# Patient Record
Sex: Male | Born: 1935 | Race: White | Hispanic: No | Smoking: Former smoker
Health system: Southern US, Community
[De-identification: ages and names within clinical notes are randomized; demographics above are authoritative.]

## PROBLEM LIST (undated history)

## (undated) DIAGNOSIS — I10 Essential (primary) hypertension: Secondary | ICD-10-CM

## (undated) DIAGNOSIS — I739 Peripheral vascular disease, unspecified: Secondary | ICD-10-CM

## (undated) DIAGNOSIS — I2510 Atherosclerotic heart disease of native coronary artery without angina pectoris: Secondary | ICD-10-CM

## (undated) DIAGNOSIS — E119 Type 2 diabetes mellitus without complications: Secondary | ICD-10-CM

## (undated) DIAGNOSIS — K219 Gastro-esophageal reflux disease without esophagitis: Secondary | ICD-10-CM

## (undated) DIAGNOSIS — M1990 Unspecified osteoarthritis, unspecified site: Secondary | ICD-10-CM

## (undated) DIAGNOSIS — I219 Acute myocardial infarction, unspecified: Secondary | ICD-10-CM

## (undated) DIAGNOSIS — I5022 Chronic systolic (congestive) heart failure: Secondary | ICD-10-CM

## (undated) DIAGNOSIS — C44321 Squamous cell carcinoma of skin of nose: Secondary | ICD-10-CM

## (undated) DIAGNOSIS — N200 Calculus of kidney: Secondary | ICD-10-CM

## (undated) DIAGNOSIS — E785 Hyperlipidemia, unspecified: Secondary | ICD-10-CM

## (undated) DIAGNOSIS — Z9289 Personal history of other medical treatment: Secondary | ICD-10-CM

## (undated) HISTORY — PX: SKIN CANCER EXCISION: SHX779

## (undated) HISTORY — PX: CATARACT EXTRACTION W/ INTRAOCULAR LENS  IMPLANT, BILATERAL: SHX1307

## (undated) HISTORY — PX: LEG AMPUTATION ABOVE KNEE: SHX117

## (undated) HISTORY — PX: ABDOMINAL HERNIA REPAIR: SHX539

## (undated) HISTORY — PX: CHOLECYSTECTOMY: SHX55

## (undated) HISTORY — PX: INGUINAL HERNIA REPAIR: SUR1180

## (undated) HISTORY — PX: CORONARY ANGIOPLASTY WITH STENT PLACEMENT: SHX49

## (undated) HISTORY — PX: CAROTID STENT: SHX1301

## (undated) HISTORY — PX: HERNIA REPAIR: SHX51

## (undated) HISTORY — PX: MELANOMA EXCISION: SHX5266

## (undated) HISTORY — PX: CORONARY ANGIOPLASTY: SHX604

## (undated) HISTORY — PX: TRANSURETHRAL RESECTION OF PROSTATE: SHX73

## (undated) HISTORY — DX: Hyperlipidemia, unspecified: E78.5

---

## 1983-11-27 HISTORY — PX: ANGIOPLASTY: SHX39

## 1994-11-26 HISTORY — PX: CORONARY ARTERY BYPASS GRAFT: SHX141

## 2013-12-09 ENCOUNTER — Inpatient Hospital Stay (HOSPITAL_COMMUNITY)
Admission: EM | Admit: 2013-12-09 | Discharge: 2013-12-16 | DRG: 247 | Disposition: A | Discharge status: Home / Self Care | Payer: Medicare Other | Attending: Cardiovascular Disease | Admitting: Cardiovascular Disease

## 2013-12-09 ENCOUNTER — Encounter (HOSPITAL_COMMUNITY): Payer: Self-pay | Admitting: Emergency Medicine

## 2013-12-09 ENCOUNTER — Encounter (HOSPITAL_COMMUNITY)
Admission: EM | Disposition: A | Discharge status: Home / Self Care | Payer: Medicare Other | Source: Home / Self Care | Attending: Cardiovascular Disease

## 2013-12-09 ENCOUNTER — Emergency Department (HOSPITAL_COMMUNITY): Payer: Medicare Other

## 2013-12-09 DIAGNOSIS — I214 Non-ST elevation (NSTEMI) myocardial infarction: Principal | ICD-10-CM | POA: Diagnosis present

## 2013-12-09 DIAGNOSIS — Z7902 Long term (current) use of antithrombotics/antiplatelets: Secondary | ICD-10-CM

## 2013-12-09 DIAGNOSIS — I252 Old myocardial infarction: Secondary | ICD-10-CM

## 2013-12-09 DIAGNOSIS — Z79899 Other long term (current) drug therapy: Secondary | ICD-10-CM

## 2013-12-09 DIAGNOSIS — I2510 Atherosclerotic heart disease of native coronary artery without angina pectoris: Secondary | ICD-10-CM | POA: Diagnosis present

## 2013-12-09 DIAGNOSIS — A310 Pulmonary mycobacterial infection: Secondary | ICD-10-CM | POA: Diagnosis present

## 2013-12-09 DIAGNOSIS — I200 Unstable angina: Secondary | ICD-10-CM | POA: Insufficient documentation

## 2013-12-09 DIAGNOSIS — I509 Heart failure, unspecified: Secondary | ICD-10-CM

## 2013-12-09 DIAGNOSIS — I25810 Atherosclerosis of coronary artery bypass graft(s) without angina pectoris: Secondary | ICD-10-CM

## 2013-12-09 DIAGNOSIS — Z7982 Long term (current) use of aspirin: Secondary | ICD-10-CM

## 2013-12-09 DIAGNOSIS — G547 Phantom limb syndrome without pain: Secondary | ICD-10-CM | POA: Diagnosis present

## 2013-12-09 DIAGNOSIS — I059 Rheumatic mitral valve disease, unspecified: Secondary | ICD-10-CM | POA: Diagnosis present

## 2013-12-09 DIAGNOSIS — L03119 Cellulitis of unspecified part of limb: Secondary | ICD-10-CM

## 2013-12-09 DIAGNOSIS — L02519 Cutaneous abscess of unspecified hand: Secondary | ICD-10-CM | POA: Diagnosis present

## 2013-12-09 DIAGNOSIS — I739 Peripheral vascular disease, unspecified: Secondary | ICD-10-CM | POA: Diagnosis present

## 2013-12-09 DIAGNOSIS — S78119A Complete traumatic amputation at level between unspecified hip and knee, initial encounter: Secondary | ICD-10-CM

## 2013-12-09 DIAGNOSIS — Z9089 Acquired absence of other organs: Secondary | ICD-10-CM

## 2013-12-09 DIAGNOSIS — R230 Cyanosis: Secondary | ICD-10-CM | POA: Diagnosis present

## 2013-12-09 DIAGNOSIS — Z888 Allergy status to other drugs, medicaments and biological substances status: Secondary | ICD-10-CM

## 2013-12-09 DIAGNOSIS — Z9861 Coronary angioplasty status: Secondary | ICD-10-CM

## 2013-12-09 DIAGNOSIS — I10 Essential (primary) hypertension: Secondary | ICD-10-CM

## 2013-12-09 DIAGNOSIS — I2582 Chronic total occlusion of coronary artery: Secondary | ICD-10-CM | POA: Diagnosis present

## 2013-12-09 DIAGNOSIS — I70229 Atherosclerosis of native arteries of extremities with rest pain, unspecified extremity: Secondary | ICD-10-CM | POA: Diagnosis present

## 2013-12-09 DIAGNOSIS — Z87891 Personal history of nicotine dependence: Secondary | ICD-10-CM

## 2013-12-09 HISTORY — DX: Peripheral vascular disease, unspecified: I73.9

## 2013-12-09 HISTORY — DX: Atherosclerotic heart disease of native coronary artery without angina pectoris: I25.10

## 2013-12-09 HISTORY — DX: Chronic systolic (congestive) heart failure: I50.22

## 2013-12-09 HISTORY — DX: Personal history of other medical treatment: Z92.89

## 2013-12-09 HISTORY — DX: Unspecified osteoarthritis, unspecified site: M19.90

## 2013-12-09 HISTORY — PX: LEFT HEART CATHETERIZATION WITH CORONARY/GRAFT ANGIOGRAM: SHX5450

## 2013-12-09 HISTORY — PX: PERCUTANEOUS CORONARY STENT INTERVENTION (PCI-S): SHX5485

## 2013-12-09 HISTORY — DX: Essential (primary) hypertension: I10

## 2013-12-09 LAB — URINALYSIS, ROUTINE W REFLEX MICROSCOPIC
Bilirubin Urine: NEGATIVE
GLUCOSE, UA: NEGATIVE mg/dL
HGB URINE DIPSTICK: NEGATIVE
Ketones, ur: NEGATIVE mg/dL
Leukocytes, UA: NEGATIVE
Nitrite: NEGATIVE
PH: 5 (ref 5.0–8.0)
Protein, ur: NEGATIVE mg/dL
SPECIFIC GRAVITY, URINE: 1.02 (ref 1.005–1.030)
Urobilinogen, UA: 0.2 mg/dL (ref 0.0–1.0)

## 2013-12-09 LAB — POCT ACTIVATED CLOTTING TIME
Activated Clotting Time: 105 seconds
Activated Clotting Time: 382 seconds

## 2013-12-09 LAB — COMPREHENSIVE METABOLIC PANEL
ALBUMIN: 3.6 g/dL (ref 3.5–5.2)
ALT: 16 U/L (ref 0–53)
AST: 13 U/L (ref 0–37)
Alkaline Phosphatase: 71 U/L (ref 39–117)
BUN: 26 mg/dL — ABNORMAL HIGH (ref 6–23)
CALCIUM: 9.1 mg/dL (ref 8.4–10.5)
CO2: 21 meq/L (ref 19–32)
Chloride: 103 mEq/L (ref 96–112)
Creatinine, Ser: 1.03 mg/dL (ref 0.50–1.35)
GFR calc Af Amer: 74 mL/min — ABNORMAL LOW (ref >90)
GFR calc non Af Amer: 64 mL/min — ABNORMAL LOW (ref >90)
Glucose, Bld: 123 mg/dL — ABNORMAL HIGH (ref 70–99)
Potassium: 4.8 mEq/L (ref 3.7–5.3)
SODIUM: 138 meq/L (ref 137–147)
Total Bilirubin: 0.3 mg/dL (ref 0.3–1.2)
Total Protein: 7 g/dL (ref 6.0–8.3)

## 2013-12-09 LAB — CBC WITH DIFFERENTIAL/PLATELET
BASOS ABS: 0 10*3/uL (ref 0.0–0.1)
Basophils Relative: 0 % (ref 0–1)
EOS PCT: 1 % (ref 0–5)
Eosinophils Absolute: 0.1 10*3/uL (ref 0.0–0.7)
HCT: 33.5 % — ABNORMAL LOW (ref 39.0–52.0)
Hemoglobin: 11.3 g/dL — ABNORMAL LOW (ref 13.0–17.0)
Lymphocytes Relative: 15 % (ref 12–46)
Lymphs Abs: 1 10*3/uL (ref 0.7–4.0)
MCH: 29 pg (ref 26.0–34.0)
MCHC: 33.7 g/dL (ref 30.0–36.0)
MCV: 85.9 fL (ref 78.0–100.0)
Monocytes Absolute: 0.8 10*3/uL (ref 0.1–1.0)
Monocytes Relative: 12 % (ref 3–12)
NEUTROS ABS: 4.9 10*3/uL (ref 1.7–7.7)
Neutrophils Relative %: 72 % (ref 43–77)
PLATELETS: 93 10*3/uL — AB (ref 150–400)
RBC: 3.9 MIL/uL — ABNORMAL LOW (ref 4.22–5.81)
RDW: 16 % — ABNORMAL HIGH (ref 11.5–15.5)
WBC: 6.8 10*3/uL (ref 4.0–10.5)

## 2013-12-09 LAB — TROPONIN I
TROPONIN I: 0.39 ng/mL — AB (ref <0.30)
Troponin I: 0.35 ng/mL (ref <0.30)
Troponin I: 0.45 ng/mL (ref <0.30)

## 2013-12-09 LAB — PROTIME-INR
INR: 1.08 (ref 0.00–1.49)
Prothrombin Time: 13.8 seconds (ref 11.6–15.2)

## 2013-12-09 LAB — PRO B NATRIURETIC PEPTIDE: PRO B NATRI PEPTIDE: 1429 pg/mL — AB (ref 0–450)

## 2013-12-09 LAB — DIGOXIN LEVEL: Digoxin Level: 1.1 ng/mL (ref 0.8–2.0)

## 2013-12-09 LAB — MRSA PCR SCREENING: MRSA by PCR: NEGATIVE

## 2013-12-09 SURGERY — LEFT HEART CATHETERIZATION WITH CORONARY/GRAFT ANGIOGRAM
Anesthesia: LOCAL

## 2013-12-09 MED ORDER — FAMOTIDINE 20 MG PO TABS
20.0000 mg | ORAL_TABLET | Freq: Every day | ORAL | Status: DC
  Administered 2013-12-09 – 2013-12-16 (×8): 20 mg via ORAL
  Filled 2013-12-09 (×8): qty 1

## 2013-12-09 MED ORDER — SULFAMETHOXAZOLE-TMP DS 800-160 MG PO TABS
1.0000 | ORAL_TABLET | Freq: Two times a day (BID) | ORAL | Status: DC
  Administered 2013-12-09 – 2013-12-16 (×15): 1 via ORAL
  Filled 2013-12-09 (×16): qty 1

## 2013-12-09 MED ORDER — DIGOXIN 250 MCG PO TABS
0.2500 mg | ORAL_TABLET | Freq: Every day | ORAL | Status: DC
  Filled 2013-12-09: qty 1

## 2013-12-09 MED ORDER — GABAPENTIN 300 MG PO CAPS
300.0000 mg | ORAL_CAPSULE | Freq: Three times a day (TID) | ORAL | Status: DC
  Administered 2013-12-09 – 2013-12-16 (×21): 300 mg via ORAL
  Filled 2013-12-09 (×24): qty 1

## 2013-12-09 MED ORDER — NITROGLYCERIN 0.4 MG SL SUBL: 0.4000 mg | SUBLINGUAL_TABLET | SUBLINGUAL | Status: DC | PRN

## 2013-12-09 MED ORDER — ASPIRIN 81 MG PO CHEW
324.0000 mg | CHEWABLE_TABLET | ORAL | Status: AC
  Administered 2013-12-09: 324 mg via ORAL

## 2013-12-09 MED ORDER — AMLODIPINE BESYLATE 5 MG PO TABS
5.0000 mg | ORAL_TABLET | Freq: Every day | ORAL | Status: DC
  Administered 2013-12-09 – 2013-12-16 (×8): 5 mg via ORAL
  Filled 2013-12-09 (×8): qty 1

## 2013-12-09 MED ORDER — LIDOCAINE HCL (PF) 1 % IJ SOLN
INTRAMUSCULAR | Status: AC
  Filled 2013-12-09: qty 30

## 2013-12-09 MED ORDER — ALUM & MAG HYDROXIDE-SIMETH 200-200-20 MG/5ML PO SUSP: 15.0000 mL | Freq: Three times a day (TID) | ORAL | Status: DC | PRN

## 2013-12-09 MED ORDER — CLOPIDOGREL BISULFATE 75 MG PO TABS: 75.0000 mg | ORAL_TABLET | Freq: Every day | ORAL | Status: DC

## 2013-12-09 MED ORDER — HEPARIN (PORCINE) IN NACL 2-0.9 UNIT/ML-% IJ SOLN
INTRAMUSCULAR | Status: AC
  Filled 2013-12-09: qty 500

## 2013-12-09 MED ORDER — METOPROLOL TARTRATE 100 MG PO TABS
100.0000 mg | ORAL_TABLET | Freq: Two times a day (BID) | ORAL | Status: DC
  Administered 2013-12-09 – 2013-12-16 (×14): 100 mg via ORAL
  Filled 2013-12-09 (×16): qty 1

## 2013-12-09 MED ORDER — LIDOCAINE 5 % EX PTCH
1.0000 | MEDICATED_PATCH | Freq: Two times a day (BID) | CUTANEOUS | Status: DC
  Administered 2013-12-09 – 2013-12-16 (×15): 1 via TRANSDERMAL
  Filled 2013-12-09 (×19): qty 1

## 2013-12-09 MED ORDER — FENTANYL CITRATE 0.05 MG/ML IJ SOLN
INTRAMUSCULAR | Status: AC
  Administered 2013-12-09: 25 ug via INTRAVENOUS
  Filled 2013-12-09: qty 2

## 2013-12-09 MED ORDER — CLOPIDOGREL BISULFATE 75 MG PO TABS
75.0000 mg | ORAL_TABLET | Freq: Every day | ORAL | Status: DC
  Administered 2013-12-10: 75 mg via ORAL
  Filled 2013-12-09 (×4): qty 1

## 2013-12-09 MED ORDER — DOCUSATE SODIUM 100 MG PO CAPS
100.0000 mg | ORAL_CAPSULE | Freq: Two times a day (BID) | ORAL | Status: DC
  Administered 2013-12-09 – 2013-12-16 (×15): 100 mg via ORAL
  Filled 2013-12-09 (×16): qty 1

## 2013-12-09 MED ORDER — NITROGLYCERIN 0.2 MG/ML ON CALL CATH LAB
INTRAVENOUS | Status: AC
  Filled 2013-12-09: qty 1

## 2013-12-09 MED ORDER — DUTASTERIDE 0.5 MG PO CAPS
0.5000 mg | ORAL_CAPSULE | Freq: Every day | ORAL | Status: DC
  Administered 2013-12-09 – 2013-12-16 (×8): 0.5 mg via ORAL
  Filled 2013-12-09 (×8): qty 1

## 2013-12-09 MED ORDER — AZITHROMYCIN 500 MG PO TABS
500.0000 mg | ORAL_TABLET | Freq: Every day | ORAL | Status: DC
  Administered 2013-12-09 – 2013-12-16 (×8): 500 mg via ORAL
  Filled 2013-12-09 (×8): qty 1

## 2013-12-09 MED ORDER — SODIUM CHLORIDE 0.9 % IJ SOLN: 3.0000 mL | Freq: Two times a day (BID) | INTRAMUSCULAR | Status: DC

## 2013-12-09 MED ORDER — SODIUM CHLORIDE 0.9 % IV SOLN
INTRAVENOUS | Status: DC
Start: 2013-12-09 — End: 2013-12-10
  Administered 2013-12-09: 08:00:00 via INTRAVENOUS

## 2013-12-09 MED ORDER — HEPARIN BOLUS VIA INFUSION
3000.0000 [IU] | Freq: Once | INTRAVENOUS | Status: DC
  Filled 2013-12-09: qty 3000

## 2013-12-09 MED ORDER — FUROSEMIDE 40 MG PO TABS
40.0000 mg | ORAL_TABLET | Freq: Every day | ORAL | Status: DC
Start: 2013-12-10 — End: 2013-12-16
  Administered 2013-12-10 – 2013-12-16 (×7): 40 mg via ORAL
  Filled 2013-12-09 (×7): qty 1

## 2013-12-09 MED ORDER — SODIUM CHLORIDE 0.9 % IJ SOLN: 3.0000 mL | INTRAMUSCULAR | Status: DC | PRN

## 2013-12-09 MED ORDER — FERROUS SULFATE 325 (65 FE) MG PO TABS
325.0000 mg | ORAL_TABLET | Freq: Every evening | ORAL | Status: DC
  Administered 2013-12-09 – 2013-12-16 (×8): 325 mg via ORAL
  Filled 2013-12-09 (×8): qty 1

## 2013-12-09 MED ORDER — ADENOSINE 6 MG/2ML IV SOLN
INTRAVENOUS | Status: AC
  Filled 2013-12-09: qty 2

## 2013-12-09 MED ORDER — HEPARIN (PORCINE) IN NACL 100-0.45 UNIT/ML-% IJ SOLN
750.0000 [IU]/h | INTRAMUSCULAR | Status: DC
  Administered 2013-12-09: 750 [IU]/h via INTRAVENOUS
  Filled 2013-12-09: qty 250

## 2013-12-09 MED ORDER — LISINOPRIL 20 MG PO TABS
20.0000 mg | ORAL_TABLET | Freq: Every day | ORAL | Status: DC
Start: 2013-12-09 — End: 2013-12-16
  Administered 2013-12-09 – 2013-12-16 (×8): 20 mg via ORAL
  Filled 2013-12-09 (×8): qty 1

## 2013-12-09 MED ORDER — DIGOXIN 125 MCG PO TABS
0.1250 mg | ORAL_TABLET | Freq: Every day | ORAL | Status: DC
  Administered 2013-12-10 – 2013-12-16 (×7): 0.125 mg via ORAL
  Filled 2013-12-09 (×7): qty 1

## 2013-12-09 MED ORDER — NITROGLYCERIN 0.4 MG/SPRAY TL SOLN
Status: AC
  Filled 2013-12-09: qty 4.9

## 2013-12-09 MED ORDER — ASPIRIN EC 81 MG PO TBEC
81.0000 mg | DELAYED_RELEASE_TABLET | Freq: Every day | ORAL | Status: DC
Start: 2013-12-10 — End: 2013-12-10
  Filled 2013-12-09: qty 1

## 2013-12-09 MED ORDER — SODIUM CHLORIDE 0.9 % IV SOLN
INTRAVENOUS | Status: AC
Start: 2013-12-09 — End: 2013-12-09

## 2013-12-09 MED ORDER — RIFAMPIN 150 MG PO CAPS: 150.0000 mg | ORAL_CAPSULE | Freq: Every day | ORAL | Status: DC

## 2013-12-09 MED ORDER — HYDROCODONE-ACETAMINOPHEN 10-325 MG PO TABS
1.0000 | ORAL_TABLET | Freq: Four times a day (QID) | ORAL | Status: DC | PRN
  Administered 2013-12-09 – 2013-12-16 (×15): 1 via ORAL
  Filled 2013-12-09 (×17): qty 1

## 2013-12-09 MED ORDER — SODIUM CHLORIDE 0.9 % IV SOLN: 250.0000 mL | INTRAVENOUS | Status: DC | PRN

## 2013-12-09 MED ORDER — FENTANYL CITRATE 0.05 MG/ML IJ SOLN
25.0000 ug | INTRAMUSCULAR | Status: DC | PRN
  Administered 2013-12-09 – 2013-12-10 (×8): 25 ug via INTRAVENOUS
  Filled 2013-12-09 (×8): qty 2

## 2013-12-09 MED ORDER — MIDAZOLAM HCL 2 MG/2ML IJ SOLN
INTRAMUSCULAR | Status: AC
  Filled 2013-12-09: qty 2

## 2013-12-09 MED ORDER — BIVALIRUDIN 250 MG IV SOLR
INTRAVENOUS | Status: AC
  Filled 2013-12-09: qty 250

## 2013-12-09 MED ORDER — HEPARIN (PORCINE) IN NACL 2-0.9 UNIT/ML-% IJ SOLN
INTRAMUSCULAR | Status: AC
Start: 2013-12-09 — End: 2013-12-09
  Filled 2013-12-09: qty 1000

## 2013-12-09 MED ORDER — SODIUM CHLORIDE 0.9 % IV SOLN
0.2500 mg/kg/h | INTRAVENOUS | Status: AC
  Administered 2013-12-09: 0.25 mg/kg/h via INTRAVENOUS

## 2013-12-09 MED ORDER — HEPARIN BOLUS VIA INFUSION
2000.0000 [IU] | Freq: Once | INTRAVENOUS | Status: AC
  Administered 2013-12-09: 2000 [IU] via INTRAVENOUS
  Filled 2013-12-09: qty 2000

## 2013-12-09 MED ORDER — ASPIRIN 300 MG RE SUPP
300.0000 mg | RECTAL | Status: AC
Start: 2013-12-09 — End: 2013-12-09

## 2013-12-09 MED ORDER — CLOPIDOGREL BISULFATE 300 MG PO TABS
ORAL_TABLET | ORAL | Status: AC
  Filled 2013-12-09: qty 2

## 2013-12-09 MED ORDER — HEPARIN SODIUM (PORCINE) 1000 UNIT/ML IJ SOLN
INTRAMUSCULAR | Status: AC
  Filled 2013-12-09: qty 1

## 2013-12-09 MED ORDER — CLONIDINE HCL 0.1 MG PO TABS
0.1000 mg | ORAL_TABLET | Freq: Three times a day (TID) | ORAL | Status: DC | PRN
  Administered 2013-12-12: 0.1 mg via ORAL
  Filled 2013-12-09: qty 1

## 2013-12-09 MED ORDER — ACETAMINOPHEN 325 MG PO TABS
650.0000 mg | ORAL_TABLET | ORAL | Status: DC | PRN
  Administered 2013-12-11: 650 mg via ORAL
  Filled 2013-12-09: qty 2

## 2013-12-09 MED ORDER — ONDANSETRON HCL 4 MG/2ML IJ SOLN: 4.0000 mg | Freq: Four times a day (QID) | INTRAMUSCULAR | Status: DC | PRN

## 2013-12-09 MED ORDER — RIFAMPIN 300 MG PO CAPS
300.0000 mg | ORAL_CAPSULE | Freq: Two times a day (BID) | ORAL | Status: DC
  Administered 2013-12-09 – 2013-12-16 (×15): 300 mg via ORAL
  Filled 2013-12-09 (×16): qty 1

## 2013-12-09 MED ORDER — FAMOTIDINE IN NACL 20-0.9 MG/50ML-% IV SOLN
INTRAVENOUS | Status: AC
  Filled 2013-12-09: qty 50

## 2013-12-09 NOTE — ED Notes (Signed)
Pt taken to radiology

## 2013-12-09 NOTE — ED Notes (Signed)
Starting tonight pt has reported chest discomfort radiating into his jaw.  Per EMS, pt has had 5 cardiac caths, and possibly 2 MI's.  3 sl nitro and 324 of ASA given in route.  Pt reports no chest discomfort at this time, but does have phantom pain in his right AKA leg.

## 2013-12-09 NOTE — ED Notes (Signed)
Pt comes from assisted living for left chest pain radiating into his jaw.  Pt was given 3 0.4 SL NTG by ems and is currently pain free.  Pt's son states that he recently moved from [place] following AKA surgery and was placed in assisted living.  Son states that son has decreased circulation in his left leg, and is supposed to be seeing a vascular surgeon later today.

## 2013-12-09 NOTE — H&P (Signed)
Chief Complaint:  Unstable angina  HPI:  This is a 80 y.o. male widowed Caucasian male father of 3 children who we are asked to see because of unstable angina. He recently relocated from [place] to [place] to be closer to his family.he has an extensive past medical history remarkable for coronary artery disease and peripheral arterial disease. He had angioplasty of his heart performed 05/14/84. Following this he had open heart surgery with coronary artery bypass grafting x1/1/96 for what sounds like felt enteroplasty. He had a heart attack 10/17/1999 and again in 11/30/99. He had angioplasty and stent 02/14/03 and 11/02/04. He really has not had any chest pain since that time until last night when he had 3 episodes of nitroglycerin responsive chest pain. He currently is chest pain-free. His history is remarkable for peripheral arterial disease as well status post lower extremity integrate him 07/06/98 with enteroplasty of ischemia right leg 08/24/98. He had right leg bypass 08/26/98 and what sounds like aortobifemoral bypass grafting for an abdominal aortic aneurysm and arterial occlusive disease in 2009. Most recently this past year he had right above-the-knee amputation performed on multiple occasions beginning 05/28/13 with multiple revisions because of poor wound healing the last surgery being 07/21/13.  He does have the limb ischemia with  Cyanosis of his left first and fourth toes and resting leg pain. He saw a podiatrist last week for this and was referred to a vascular surgeon for further evaluation. He smoked 3-4 packs a day up until 1985 other problems include 2 hypertension. He's never had a stroke. EKG here in the hospital shows lateral T-wave inversion although we do not have an old EKG.  PMHx:  Past Medical History  Diagnosis Date  . Coronary artery disease     sstatus post angioplasty in 1985, coronary artery bypass grafting x1  in 1996, heart attack in 2000,,  angioplasty and stent in 2004 and 2005  . Peripheral arterial disease     sstatus post lower extremity angiogram 07/06/98, angioplasty leg  and kidney 08/24/98, bypass right leg 08/26/98, aortobifemoral bypass grafting in 2009, right AKA July and August of 2014  . Hypertension   . Unstable angina     Past Surgical History  Procedure Laterality Date  . Carotid stent    . Leg amputation above knee    . Transurethral resection of prostate    . Angioplasty    . Coronary angioplasty    . Cholecystectomy      FAMHx:  No family history on file.  SOCHx:   reports that he quit smoking about 29 years ago. He does not have any smokeless tobacco history on file. He reports that he does not drink alcohol or use illicit drugs.  ALLERGIES:  Allergies  Allergen Reactions  . Chlorpazine [Prochlorperazine] Other (See Comments)    unknown  . Metoclopramide Other (See Comments)    hyperactivity  . Oxycodone Other (See Comments)    Causes confusion  . Tape Hives and Other (See Comments)    blisters    ROS: A comprehensive review of systems was negative.  HOME MEDS:  (Not in a hospital admission)  LABS/IMAGING: Results for orders placed during the hospital encounter of 12/09/13 (from the past 48 hour(s))  CBC WITH DIFFERENTIAL     Status: Abnormal   Collection Time    12/09/13  4:30 AM      Result Value Range   WBC 6.8  4.0 - 10.5  K/uL   RBC 3.90 (*) 4.22 - 5.81 MIL/uL   Hemoglobin 11.3 (*) 13.0 - 17.0 g/dL   HCT 33.5 (*) 39.0 - 52.0 %   MCV 85.9  78.0 - 100.0 fL   MCH 29.0  26.0 - 34.0 pg   MCHC 33.7  30.0 - 36.0 g/dL   RDW 16.0 (*) 11.5 - 15.5 %   Platelets 93 (*) 150 - 400 K/uL   Comment: PLATELET COUNT CONFIRMED BY SMEAR     REPEATED TO VERIFY     SPECIMEN CHECKED FOR CLOTS   Neutrophils Relative % 72  43 - 77 %   Neutro Abs 4.9  1.7 - 7.7 K/uL   Lymphocytes Relative 15  12 - 46 %   Lymphs Abs 1.0  0.7 - 4.0 K/uL   Monocytes Relative 12  3 - 12 %   Monocytes Absolute 0.8   0.1 - 1.0 K/uL   Eosinophils Relative 1  0 - 5 %   Eosinophils Absolute 0.1  0.0 - 0.7 K/uL   Basophils Relative 0  0 - 1 %   Basophils Absolute 0.0  0.0 - 0.1 K/uL  COMPREHENSIVE METABOLIC PANEL     Status: Abnormal   Collection Time    12/09/13  4:30 AM      Result Value Range   Sodium 138  137 - 147 mEq/L   Potassium 4.8  3.7 - 5.3 mEq/L   Chloride 103  96 - 112 mEq/L   CO2 21  19 - 32 mEq/L   Glucose, Bld 123 (*) 70 - 99 mg/dL   BUN 26 (*) 6 - 23 mg/dL   Creatinine, Ser 1.03  0.50 - 1.35 mg/dL   Calcium 9.1  8.4 - 10.5 mg/dL   Total Protein 7.0  6.0 - 8.3 g/dL   Albumin 3.6  3.5 - 5.2 g/dL   AST 13  0 - 37 U/L   ALT 16  0 - 53 U/L   Alkaline Phosphatase 71  39 - 117 U/L   Total Bilirubin 0.3  0.3 - 1.2 mg/dL   GFR calc non Af Amer 64 (*) >90 mL/min   GFR calc Af Amer 74 (*) >90 mL/min   Comment: (NOTE)     The eGFR has been calculated using the CKD EPI equation.     This calculation has not been validated in all clinical situations.     eGFR's persistently <90 mL/min signify possible Chronic Kidney     Disease.  TROPONIN I     Status: None   Collection Time    12/09/13  4:30 AM      Result Value Range   Troponin I <0.30  <0.30 ng/mL   Comment:            Due to the release kinetics of cTnI,     a negative result within the first hours     of the onset of symptoms does not rule out     myocardial infarction with certainty.     If myocardial infarction is still suspected,     repeat the test at appropriate intervals.  PRO B NATRIURETIC PEPTIDE     Status: Abnormal   Collection Time    12/09/13  4:30 AM      Result Value Range   Pro B Natriuretic peptide (BNP) 1429.0 (*) 0 - 450 pg/mL  URINALYSIS, ROUTINE W REFLEX MICROSCOPIC     Status: Abnormal   Collection Time    12/09/13  5:23 AM      Result Value Range   Color, Urine AMBER (*) YELLOW   Comment: BIOCHEMICALS MAY BE AFFECTED BY COLOR   APPearance CLEAR  CLEAR   Specific Gravity, Urine 1.020  1.005 - 1.030    pH 5.0  5.0 - 8.0   Glucose, UA NEGATIVE  NEGATIVE mg/dL   Hgb urine dipstick NEGATIVE  NEGATIVE   Bilirubin Urine NEGATIVE  NEGATIVE   Ketones, ur NEGATIVE  NEGATIVE mg/dL   Protein, ur NEGATIVE  NEGATIVE mg/dL   Urobilinogen, UA 0.2  0.0 - 1.0 mg/dL   Nitrite NEGATIVE  NEGATIVE   Leukocytes, UA NEGATIVE  NEGATIVE   Comment: MICROSCOPIC NOT DONE ON URINES WITH NEGATIVE PROTEIN, BLOOD, LEUKOCYTES, NITRITE, OR GLUCOSE <1000 mg/dL.   Dg Chest 2 View  12/09/2013   CLINICAL DATA:  Chest pain radiating to be  EXAM: CHEST  2 VIEW  COMPARISON:  None.  FINDINGS: There is diffuse interstitial coarsening which is likely chronic. No overt edema. No consolidation. No cardiomegaly. Previous median sternotomy and coronary stenting. No effusion or pneumothorax.  IMPRESSION: No active cardiopulmonary disease.   Electronically Signed   By: Jorje Guild M.D.   On: 12/09/2013 05:05    VITALS: Blood pressure 180/59, pulse 70, temperature 97.8 F (36.6 C), temperature source Oral, resp. rate 12, SpO2 96.00%.  EXAM: General appearance: alert and no distress Neck: no adenopathy, no carotid bruit, no JVD, supple, symmetrical, trachea midline and thyroid not enlarged, symmetric, no tenderness/mass/nodules Lungs: clear to auscultation bilaterally Heart: regular rate and rhythm, S1, S2 normal, no murmur, click, rub or gallop Abdomen: soft, non-tender; bowel sounds normal; no masses,  no organomegaly and there are healed surgical wounds with a midline incision as well as lateral incisions from prior abdominal surgery. Extremities: there is an absent pulse on the right,, 2+ left femoral pulse with a loud bruit. He has a healed right above-the-knee amputation,, cyanosis of the left first and fourth toes with absent pedal pulses on that side.  EKG- his EKG shows a sinus rhythm with lateral T-wave inversion  IMPRESSION: 1: Coronary artery disease-the patient has known CAD status post remote coronary artery bypass  grafting with multiple stent procedures since. He has unstable angina currently pain free on multiple antianginal medications. I'm going to keep him n.p.o., heparinize him and cycle his enzymes. He'll need coronary angiography today.  2: Hypertension-controlled on current medications  3: Peripheral arterial disease-recent right above-the-knee amputation with critical limb ischemia on left  PLAN: 1: Unstable angina-cycle enzymes, systemic anticoagulation with heparin, cardiac catheterization  2: Peripheral arterial disease-the patient has critical limb ischemia. At the time of cath I recommend doing any distal abdominal aorta and right iliac angiogram to define his inflow. He he'll probably need more formal evaluation once his coronary issues have been addressed  Lorretta Harp 12/09/2013, 7:42 AM

## 2013-12-09 NOTE — Care Management Note (Addendum)
  Page 1 of 1   12/16/2013     4:02:50 PM   CARE MANAGEMENT NOTE 12/16/2013  Patient:  Nicholas, Henson   Account Number:  [id number]  Date Initiated:  12/09/2013  Documentation initiated by:  Elissa Hefty  Subjective/Objective Assessment:   adm w angina     Action/Plan:   lives at [facility],   Anticipated DC Date:     Anticipated DC Plan:  ASSISTED LIVING / [place]  In-house referral  Clinical Social Worker      DC Planning Services  CM consult      Choice offered to / List presented to:             Status of service:   Medicare Important Message given?   (If response is "NO", the following Medicare IM given date fields will be blank) Date Medicare IM given:   Date Additional Medicare IM given:    Discharge Disposition:  ASSISTED LIVING  Per UR Regulation:  Reviewed for med. necessity/level of care/duration of stay  If discussed at [place] of Stay Meetings, dates discussed:   12/15/2013    Comments:  12/16/2013 Hx/o Right AKA from [place]. Dispositon:  d/c to [facility]   1/20 per card rehab, pt at alf. sw ref made. poss dc on 1-21 back to alf.

## 2013-12-09 NOTE — CV Procedure (Signed)
Cardiac Catheterization Procedure Note  Name: Nicholas Derner Sr. MRN: [id number] DOB: June 09, 1925  Procedure: Left Heart Cath, Selective Coronary Angiography, SVG angiography LV angiography,  direct stenting of SVG to LAD with a drug-eluting stent.  Indication: Non-ST elevation myocardial infarction.   Medications:  Sedation:  1 mg IV Versed, 100 mcg IV Fentanyl  Contrast:  160 mL Omnipaque  Diagnostic Procedure Details: The left groin was prepped, draped, and anesthetized with 1% lidocaine. A micropuncture needle was used to engage the left common femoral artery. However, the entry site was above the aortobifem bypass. Thus, the wire could not be advanced. I decided to use ultrasound guidance to visualize the graft and directly gain access in the graft with a micropuncture needle. A micropuncture sheath was placed and then was replaced into a 6 French sheath.  Standard Judkins catheters were used for selective coronary angiography and left ventriculography. Catheter exchanges were performed over a wire.  The diagnostic procedure was well-tolerated without immediate complications.  Procedural Findings:  Hemodynamics: AO:  164/64   mmHg LV:  166/5    mmHg LVEDP: 31  mmHg  Coronary angiography: Coronary dominance: Right   Left Main:  Mildly calcified with 30% mid eccentric plaque.  Left Anterior Descending (LAD):  Severely calcified and occluded in the midsegment.  Circumflex (LCx):  Mildly calcified with 20% ostial disease.  1st obtuse marginal:  Normal in size with 95% proximal stenosis.  2nd obtuse marginal:  Normal in size with minor irregularities.  3rd obtuse marginal:  Normal in size with no significant disease.   Right Coronary Artery: Normal in size and occluded in the midsegment. The vessel gives collaterals from the left circumflex.  SVG to LAD: There is a 99% hazy stenosis in the midsegment just before the previously placed stent. This stent is patent with 20%  in-stent restenosis. Distally, there is a 40% stenosis.  SVG to RCA: Occluded at the ostium.  Left ventriculography: Left ventricular systolic function is severely reduced , LVEF is estimated at 25-30 %, there is moderate mitral regurgitation . There is significant mid to distal anterior wall hypokinesis and moderate basal inferior wall hypokinesis.  PCI Procedure Note:  Following the diagnostic procedure, the decision was made to proceed with PCI. I discussed the case with Dr. Quay Burow. This is clearly a high-risk PCI. The sheath was upsized to a 6 [place]. Weight-based bivalirudin was given for anticoagulation. Once a therapeutic ACT was achieved, a 6 [place] JR 4 guide catheter was inserted.  A run through coronary guidewire was used to cross the lesion.  A 5 mm spider fx distal protection device was placed distal to the stenosis.  The lesion was then stented with a 4.0 x 15 mm Xience Expedition drug-eluting stent.  The distal protection device was retrieved. Following PCI, there was 0% residual stenosis and TIMI-2 flow. This was treated successfully with a total of 480 mcg of adenosine in the graft and repeated doses and 400 mcg of nitroglycerin. Final angiography confirmed an excellent result. Femoral hemostasis was achieved with a Mynx closure device.  The patient tolerated the PCI procedure well. There were no immediate procedural complications.  The patient was transferred to the post catheterization recovery area for further monitoring.  PCI Data: Vessel - SVG to LAD/Segment - mid Percent Stenosis (pre)  99% TIMI-flow 3 Stent 4.0 x 15 mm Xience Expedition drug-eluting stent Percent Stenosis (post) 0% TIMI-flow (post) 3  Final Conclusions:  1. Severe three-vessel coronary artery disease with chronically  occluded LAD and RCA. Occluded SVG to RCA but the vessel gives collaterals from the left circumflex. The SVG to LAD is patent but has severe 99% hazy stenosis in the midsegment. There is  also severe disease in proximal OM 1 2. Severely reduced LV systolic function with moderately to severely elevated left ventricular end-diastolic pressure 3. Successful drug-eluting stent placement to the SVG to LAD. Slow flow was treated successfully with intra coronary adenosine.  Recommendations:  Continue dual antiplatelet therapy indefinitely if possible. Optimize heart failure treatment. Gentle diuresis. OM1 PCI can be considered for symptoms.  Kathlyn Sacramento MD, [facility] 12/09/2013, 4:51 PM

## 2013-12-09 NOTE — ED Notes (Signed)
Cardiology at the bedside.

## 2013-12-09 NOTE — ED Notes (Signed)
Patient returned from XR. 

## 2013-12-09 NOTE — Progress Notes (Addendum)
ANTICOAGULATION CONSULT NOTE - Initial Consult  Pharmacy Consult for Heparin  Indication: chest pain/ACS/Unstable angina  Allergies  Allergen Reactions  . Chlorpazine [Prochlorperazine] Other (See Comments)    unknown  . Metoclopramide Other (See Comments)    hyperactivity  . Oxycodone Other (See Comments)    Causes confusion  . Tape Hives and Other (See Comments)    blisters    Patient Measurements: Weight: 140 lb 14 oz (63.9 kg) (RN reported )   Vital Signs: Temp: 97.8 F (36.6 C) (01/14 0402) Temp src: Oral (01/14 0402) BP: 142/70 mmHg (01/14 0800) Pulse Rate: 62 (01/14 0800)  Labs:  Recent Labs  12/09/13 0430  HGB 11.3*  HCT 33.5*  PLT 93*  CREATININE 1.03  TROPONINI <0.30    CrCl is unknown because there is no height on file for the current visit.   Medical History: Past Medical History  Diagnosis Date  . Coronary artery disease     sstatus post angioplasty in 1985, coronary artery bypass grafting x1  in 1996, heart attack in 2000,, angioplasty and stent in 2004 and 2005  . Peripheral arterial disease     sstatus post lower extremity angiogram 07/06/98, angioplasty leg  and kidney 08/24/98, bypass right leg 08/26/98, aortobifemoral bypass grafting in 2009, right AKA July and August of 2014  . Hypertension   . Unstable angina     Medications:  Prescriptions prior to admission  Medication Sig Dispense Refill  . alum & mag hydroxide-simeth (MYLANTA) 200-200-20 MG/5ML suspension Take 15 mLs by mouth every 8 (eight) hours as needed for indigestion or heartburn.      Marland Kitchen amLODipine (NORVASC) 5 MG tablet Take 5 mg by mouth daily.      Marland Kitchen azithromycin (ZITHROMAX) 250 MG tablet Take 500 mg by mouth daily. 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  This treatment is for 1 year ending 03/26/14.      . cloNIDine (CATAPRES) 0.1 MG tablet Take 0.1 mg by mouth 3 (three) times daily as needed (if sbp >175).      . clopidogrel (PLAVIX) 75 MG tablet Take 75 mg by  mouth daily with breakfast.      . digoxin (LANOXIN) 0.25 MG tablet Take 0.25 mg by mouth daily.      Marland Kitchen docusate sodium (COLACE) 100 MG capsule Take 100 mg by mouth 2 (two) times daily.      Marland Kitchen dutasteride (AVODART) 0.5 MG capsule Take 0.5 mg by mouth daily.      . ferrous sulfate 325 (65 FE) MG tablet Take 325 mg by mouth every evening.      . gabapentin (NEURONTIN) 300 MG capsule Take 300 mg by mouth 3 (three) times daily.      Marland Kitchen HYDROcodone-acetaminophen (NORCO) 10-325 MG per tablet Take 1 tablet by mouth every 6 (six) hours as needed for moderate pain or severe pain.      Marland Kitchen lidocaine (LIDODERM) 5 % Place 1 patch onto the skin every 12 (twelve) hours. Apply 1 patch to right lower extremity stump, leave on for 12 hours and remove.      Marland Kitchen lisinopril (PRINIVIL,ZESTRIL) 20 MG tablet Take 20 mg by mouth daily.      . metoprolol (LOPRESSOR) 100 MG tablet Take 100 mg by mouth 2 (two) times daily.      . Multiple Vitamin (MULTIVITAMIN WITH MINERALS) TABS tablet Take 1 tablet by mouth daily.      . nitroGLYCERIN (NITROSTAT) 0.4 MG SL tablet Place 0.4 mg  under the tongue every 5 (five) minutes as needed for chest pain.      . ranitidine (ZANTAC) 150 MG tablet Take 150 mg by mouth 2 (two) times daily.      . rifampin (RIFADIN) 150 MG capsule Take 150 mg by mouth daily.      Marland Kitchen sulfamethoxazole-trimethoprim (BACTRIM DS) 800-160 MG per tablet Take 1 tablet by mouth 2 (two) times daily. 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  Treatment ending 03/26/14      . vitamin C (ASCORBIC ACID) 500 MG tablet Take 500 mg by mouth 2 (two) times daily.      Marland Kitchen zinc sulfate 220 MG capsule Take 220 mg by mouth daily.        Assessment: 5 YOM with CAD with unstable angina. Plan to heparinize him and cycle his enzymes for eventual cardiac catheterization. Trop neg. H/H 11.3/33.5.   Goal of Therapy:  Heparin level 0.3-0.7 units/ml Monitor platelets by anticoagulation protocol: Yes   Plan:  Give  3000 units bolus x 1 Start heparin infusion at 750 units/hr Check anti-Xa level in 8 hours and daily while on heparin Continue to monitor H&H and platelets  Albertina Parr, PharmD.  Clinical Pharmacist Pager [phone]   Addendum: Due to thrombocytopenia (plt count 93), will give smaller bolus of 2000 units. Plan to monitor CBC carefull.   Lucio Edward, PharmD.  Clinical Pharmacist

## 2013-12-09 NOTE — Interval H&P Note (Signed)
Cath Lab Visit (complete for each Cath Lab visit)  Clinical Evaluation Leading to the Procedure:   ACS: yes  Non-ACS:    Anginal Classification: CCS IV  Anti-ischemic medical therapy: Minimal Therapy (1 class of medications)  Non-Invasive Test Results: No non-invasive testing performed  Prior CABG: Previous CABG      History and Physical Interval Note:  12/09/2013 3:08 PM  Nicholas Bash Sr.  has presented today for surgery, with the diagnosis of cp  The various methods of treatment have been discussed with the patient and family. After consideration of risks, benefits and other options for treatment, the patient has consented to  Procedure(s): LEFT HEART CATHETERIZATION WITH CORONARY/GRAFT ANGIOGRAM (N/A) as a surgical intervention .  The patient's history has been reviewed, patient examined, no change in status, stable for surgery.  I have reviewed the patient's chart and labs.  Questions were answered to the patient's satisfaction.     Kathlyn Sacramento

## 2013-12-09 NOTE — ED Provider Notes (Signed)
CSN: [id number]     Arrival date & time 12/09/13  0342 History   First MD Initiated Contact with Patient 12/09/13 0353     Chief Complaint  Patient presents with  . Chest Pain   (Consider location/radiation/quality/duration/timing/severity/associated sxs/prior Treatment) HPI Patient presents with 3 episodes of left-sided  chest pain starting at midnight this evening. He's had nitroglycerin which temporarily relieved the chest pain. Patient says the pain is dull and radiates up to the left jaw and left arm. EMS was called and patient received aspirin and nitroglycerin which relieved the pain completely. Patient has no pain at this time. He has extensive coronary artery disease history with multiple stent placement. He just recently moved from [place] and has not established a cardiologist. No past medical history on file. Past Surgical History  Procedure Laterality Date  . Carotid stent    . Leg amputation above knee    . Transurethral resection of prostate    . Angioplasty    . Coronary angioplasty    . Cholecystectomy     No family history on file. History  Substance Use Topics  . Smoking status: Former Smoker    Quit date: 05/14/1984  . Smokeless tobacco: Not on file  . Alcohol Use: No    Review of Systems  Constitutional: Negative for fever and chills.  Respiratory: Negative for shortness of breath.   Cardiovascular: Positive for chest pain. Negative for palpitations and leg swelling.  Gastrointestinal: Negative for nausea, vomiting and abdominal pain.  Musculoskeletal: Negative for back pain, neck pain and neck stiffness.  Skin: Negative for rash and wound.  Neurological: Negative for dizziness, weakness, light-headedness, numbness and headaches.  All other systems reviewed and are negative.    Allergies  Chlorpazine; Metoclopramide; and Oxycodone  Home Medications  No current outpatient prescriptions on file. BP 136/59  Pulse 60  Temp(Src) 97.8 F (36.6 C) (Oral)   Resp 18  SpO2 99% Physical Exam  Nursing note and vitals reviewed. Constitutional: He is oriented to person, place, and time. He appears well-developed and well-nourished. No distress.  HENT:  Head: Normocephalic and atraumatic.  Mouth/Throat: Oropharynx is clear and moist.  Eyes: EOM are normal. Pupils are equal, round, and reactive to light.  Neck: Normal range of motion. Neck supple.  Cardiovascular: Normal rate and regular rhythm.   Pulmonary/Chest: Effort normal and breath sounds normal. No respiratory distress. He has no wheezes. He has no rales. He exhibits no tenderness.  Abdominal: Soft. Bowel sounds are normal. He exhibits no distension and no mass. There is no tenderness. There is no rebound and no guarding.  Musculoskeletal: Normal range of motion. He exhibits no edema and no tenderness.  AKA to the right lower extremity. Patient has pale and dusky great toe and fourth toe left foot. There is prolonged cap refill.   Neurological: He is alert and oriented to person, place, and time.  Moves all extremities without deficit. Sensation is grossly intact.  Skin: Skin is warm and dry. No rash noted. No erythema.  Psychiatric: He has a normal mood and affect. His behavior is normal.    ED Course  Procedures (including critical care time) Labs Review Labs Reviewed  CBC WITH DIFFERENTIAL  COMPREHENSIVE METABOLIC PANEL  URINALYSIS, ROUTINE W REFLEX MICROSCOPIC  TROPONIN I  PRO B NATRIURETIC PEPTIDE   Imaging Review No results found.  EKG Interpretation   None      Date: 12/09/2013  Rate: 65  Rhythm: normal sinus rhythm  QRS Axis:  normal  Intervals: normal  ST/T Wave abnormalities: nonspecific T wave changes  Conduction Disutrbances:none  Narrative Interpretation:   Old EKG Reviewed: none available    MDM    Discussed with Dr. Gwenlyn Found. Will see in emergency department and likely admit for unstable angina. He should also remain stable in the patient is without chest  pain.  Julianne Rice, MD 12/09/13 [phone]

## 2013-12-10 ENCOUNTER — Encounter (HOSPITAL_COMMUNITY): Payer: Self-pay

## 2013-12-10 ENCOUNTER — Encounter (HOSPITAL_COMMUNITY)
Admission: EM | Disposition: A | Discharge status: Home / Self Care | Payer: Self-pay | Source: Home / Self Care | Attending: Cardiovascular Disease

## 2013-12-10 DIAGNOSIS — I70219 Atherosclerosis of native arteries of extremities with intermittent claudication, unspecified extremity: Secondary | ICD-10-CM

## 2013-12-10 DIAGNOSIS — I509 Heart failure, unspecified: Secondary | ICD-10-CM

## 2013-12-10 HISTORY — PX: LOWER EXTREMITY ANGIOGRAM: SHX5508

## 2013-12-10 HISTORY — PX: ABDOMINAL ANGIOGRAM: SHX5499

## 2013-12-10 LAB — BASIC METABOLIC PANEL
BUN: 24 mg/dL — ABNORMAL HIGH (ref 6–23)
CO2: 23 mEq/L (ref 19–32)
Calcium: 8.9 mg/dL (ref 8.4–10.5)
Chloride: 101 mEq/L (ref 96–112)
Creatinine, Ser: 0.9 mg/dL (ref 0.50–1.35)
GFR calc Af Amer: 87 mL/min — ABNORMAL LOW (ref >90)
GFR calc non Af Amer: 75 mL/min — ABNORMAL LOW (ref >90)
Glucose, Bld: 84 mg/dL (ref 70–99)
Potassium: 4.7 mEq/L (ref 3.7–5.3)
Sodium: 137 mEq/L (ref 137–147)

## 2013-12-10 LAB — CBC
HCT: 31.9 % — ABNORMAL LOW (ref 39.0–52.0)
Hemoglobin: 10.7 g/dL — ABNORMAL LOW (ref 13.0–17.0)
MCH: 29 pg (ref 26.0–34.0)
MCHC: 33.5 g/dL (ref 30.0–36.0)
MCV: 86.4 fL (ref 78.0–100.0)
Platelets: 91 10*3/uL — ABNORMAL LOW (ref 150–400)
RBC: 3.69 MIL/uL — ABNORMAL LOW (ref 4.22–5.81)
RDW: 15.6 % — ABNORMAL HIGH (ref 11.5–15.5)
WBC: 5.2 10*3/uL (ref 4.0–10.5)

## 2013-12-10 LAB — LIPID PANEL
CHOL/HDL RATIO: 4.3 ratio
CHOLESTEROL: 128 mg/dL (ref 0–200)
HDL: 30 mg/dL — AB (ref >39)
LDL Cholesterol: 74 mg/dL (ref 0–99)
Triglycerides: 120 mg/dL (ref <150)
VLDL: 24 mg/dL (ref 0–40)

## 2013-12-10 SURGERY — ABDOMINAL ANGIOGRAM
Anesthesia: LOCAL

## 2013-12-10 MED ORDER — ACETAMINOPHEN 325 MG PO TABS
650.0000 mg | ORAL_TABLET | ORAL | Status: DC | PRN
  Administered 2013-12-12 – 2013-12-13 (×2): 650 mg via ORAL
  Filled 2013-12-10 (×2): qty 2

## 2013-12-10 MED ORDER — SODIUM CHLORIDE 0.9 % IJ SOLN
3.0000 mL | Freq: Two times a day (BID) | INTRAMUSCULAR | Status: DC
  Administered 2013-12-10: 3 mL via INTRAVENOUS

## 2013-12-10 MED ORDER — CLOPIDOGREL BISULFATE 75 MG PO TABS
75.0000 mg | ORAL_TABLET | Freq: Every day | ORAL | Status: DC
  Administered 2013-12-11 – 2013-12-14 (×4): 75 mg via ORAL
  Filled 2013-12-10 (×5): qty 1

## 2013-12-10 MED ORDER — LIDOCAINE HCL (PF) 1 % IJ SOLN
INTRAMUSCULAR | Status: AC
  Filled 2013-12-10: qty 30

## 2013-12-10 MED ORDER — ONDANSETRON HCL 4 MG/2ML IJ SOLN: 4.0000 mg | Freq: Four times a day (QID) | INTRAMUSCULAR | Status: DC | PRN

## 2013-12-10 MED ORDER — ASPIRIN 81 MG PO CHEW
81.0000 mg | CHEWABLE_TABLET | ORAL | Status: AC
  Administered 2013-12-10: 81 mg via ORAL
  Filled 2013-12-10: qty 1

## 2013-12-10 MED ORDER — FENTANYL CITRATE 0.05 MG/ML IJ SOLN
INTRAMUSCULAR | Status: AC
  Filled 2013-12-10: qty 2

## 2013-12-10 MED ORDER — HYDRALAZINE HCL 20 MG/ML IJ SOLN
INTRAMUSCULAR | Status: AC
  Filled 2013-12-10: qty 1

## 2013-12-10 MED ORDER — SODIUM CHLORIDE 0.9 % IV SOLN
INTRAVENOUS | Status: DC
  Administered 2013-12-10: 09:00:00 via INTRAVENOUS

## 2013-12-10 MED ORDER — ASPIRIN EC 325 MG PO TBEC
325.0000 mg | DELAYED_RELEASE_TABLET | Freq: Every day | ORAL | Status: DC
Start: 2013-12-10 — End: 2013-12-14
  Administered 2013-12-11 – 2013-12-13 (×3): 325 mg via ORAL
  Filled 2013-12-10 (×4): qty 1

## 2013-12-10 MED ORDER — SODIUM CHLORIDE 0.9 % IJ SOLN: 3.0000 mL | INTRAMUSCULAR | Status: DC | PRN

## 2013-12-10 MED ORDER — HEPARIN (PORCINE) IN NACL 2-0.9 UNIT/ML-% IJ SOLN
INTRAMUSCULAR | Status: AC
  Filled 2013-12-10: qty 1000

## 2013-12-10 MED ORDER — SODIUM CHLORIDE 0.9 % IV SOLN: 250.0000 mL | INTRAVENOUS | Status: DC | PRN

## 2013-12-10 MED ORDER — SODIUM CHLORIDE 0.9 % IV SOLN
INTRAVENOUS | Status: AC
  Administered 2013-12-10: 11:00:00 via INTRAVENOUS

## 2013-12-10 MED ORDER — HYDRALAZINE HCL 20 MG/ML IJ SOLN
10.0000 mg | INTRAMUSCULAR | Status: DC | PRN
  Administered 2013-12-10 – 2013-12-11 (×2): 10 mg via INTRAVENOUS
  Filled 2013-12-10 (×2): qty 1

## 2013-12-10 MED ORDER — MORPHINE SULFATE 2 MG/ML IJ SOLN
1.0000 mg | INTRAMUSCULAR | Status: DC | PRN
  Administered 2013-12-11: 1 mg via INTRAVENOUS
  Filled 2013-12-10: qty 1

## 2013-12-10 MED ORDER — ASPIRIN EC 81 MG PO TBEC
81.0000 mg | DELAYED_RELEASE_TABLET | Freq: Every day | ORAL | Status: DC
  Filled 2013-12-10: qty 1

## 2013-12-10 MED FILL — Sodium Chloride IV Soln 0.9%: INTRAVENOUS | Qty: 50 | Status: AC

## 2013-12-10 NOTE — Clinical Documentation Improvement (Signed)
Possible Clinical Conditions?  Chronic Systolic Congestive Heart Failure Chronic Diastolic Congestive Heart Failure Chronic Systolic & Diastolic Congestive Heart Failure Acute Systolic Congestive Heart Failure Acute Diastolic Congestive Heart Failure Acute Systolic & Diastolic Congestive Heart Failure Acute on Chronic Systolic Congestive Heart Failure Acute on Chronic Diastolic Congestive Heart Failure Acute on Chronic Systolic & Diastolic Congestive Heart Failure Other Condition Cannot Clinically Determine   Risk Factors: CHF noted per 01/15 progress note.  Diagnostics: 01/14: proBNP: 1429.0  Thank You, Theron Arista, Clinical Documentation Specialist:  [facility] [employer] [facility]

## 2013-12-10 NOTE — Interval H&P Note (Signed)
History and Physical Interval Note:  12/10/2013 9:25 AM  Nicholas Henson Sr.  has presented today for surgery, with the diagnosis of claudication  The various methods of treatment have been discussed with the patient and family. After consideration of risks, benefits and other options for treatment, the patient has consented to  Procedure(s): ABDOMINAL ANGIOGRAM (N/A) LOWER EXTREMITY ANGIOGRAM (N/A) as a surgical intervention .  The patient's history has been reviewed, patient examined, no change in status, stable for surgery.  I have reviewed the patient's chart and labs.  Questions were answered to the patient's satisfaction.     Lorretta Harp

## 2013-12-10 NOTE — Progress Notes (Signed)
MEDICATION RELATED CONSULT NOTE - INITIAL   Pharmacy Consult for Azithromycin, Bactrim, Rifampin  Indication: Mycobacterium Avium skin infection   Allergies  Allergen Reactions  . Chlorpazine [Prochlorperazine] Other (See Comments)    unknown  . Metoclopramide Other (See Comments)    hyperactivity  . Oxycodone Other (See Comments)    Causes confusion  . 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 It seems bactrim was added on for treatment of cellulitis. Per patient, his ID physician from [place] prescribed the regimen to be continued till May, 2015. Doses are appropriate for the indication and would recommend continuing the current regimen. Please feel free to contact pharmacy for any further questions.    Albertina Parr, PharmD.  Clinical Pharmacist Pager [phone]

## 2013-12-10 NOTE — CV Procedure (Signed)
Nicholas Hengel Sr. is a 80 y.o. male    [id number] LOCATION:  FACILITY: [facility]  PHYSICIAN: Quay Burow, M.D. 31-Mar-1925   DATE OF PROCEDURE:  12/10/2013  DATE OF DISCHARGE:     PV Angiogram/Intervention    History obtained from chart review.This is a 80 y.o. male widowed Caucasian male father of 3 children who we are asked to see because of unstable angina. He recently relocated from [place] to [place] to be closer to his family.he has an extensive past medical history remarkable for coronary artery disease and peripheral arterial disease. He had angioplasty of his heart performed 05/14/84. Following this he had open heart surgery with coronary artery bypass grafting x1/1/96 for what sounds like felt enteroplasty. He had a heart attack 10/17/1999 and again in 11/30/99. He had angioplasty and stent 02/14/03 and 11/02/04. He really has not had any chest pain since that time until last night when he had 3 episodes of nitroglycerin responsive chest pain. He currently is chest pain-free. His history is remarkable for peripheral arterial disease as well status post lower extremity integrate him 07/06/98 with enteroplasty of ischemia right leg 08/24/98. He had right leg bypass 08/26/98 and what sounds like aortobifemoral bypass grafting for an abdominal aortic aneurysm and arterial occlusive disease in 2009. Most recently this past year he had right above-the-knee amputation performed on multiple occasions beginning 05/28/13 with multiple revisions because of poor wound healing the last surgery being 07/21/13. He does have the limb ischemia with Cyanosis of his left first and fourth toes and resting leg pain. He saw a podiatrist last week for this and was referred to a vascular surgeon for further evaluation. He smoked 3-4 packs a day up until 1985 other problems include 2 hypertension. He's never had a stroke. EKG here in the hospital shows lateral T-wave inversion although we do not  have an old EKG. The patient underwent cardiac catheterization yesterday by Dr. Jessie Foot revealing a 99% stenosis within the vein graft to the LAD which underwent percutaneous recatheterization with a drug-eluting stent. Because of ongoing foot pain secondary to critical limb ischemia, the patient presents now for peripheral angiography to define his anatomy    PROCEDURE DESCRIPTION:   The patient was brought to the second floor [facility] cath lab in the postabsorptive state. He was premedicated with fentanyl IV. His left groinwas prepped and shaved in usual sterile fashion. Xylocaine 1% was used  for local anesthesia. A 5 French sheath was inserted into the left limb of the aortobifemoral bypass graft using standard Seldinger technique. A 5 French pigtail catheter was placed in the distal WR a period abdominal aortography, iliac angiography the left lower extremity angiography was performed using digital subtraction bolus chase the table technique. Visipaque dye was used for the entirety of the case (83 cc administered the patient). Retrograde aortic pressure was monitored during the case.   HEMODYNAMICS:    AO SYSTOLIC/AO DIASTOLIC: 242/68   Angiographic Data:   1: Abdominal aortogram-the right limb of the aortobifemoral bypass graft was occluded at its origin.  2: Left lower extremity-the anastomosis of the aortobifem to the left common femoral artery was widely patent. The left profunda femoris artery with 190% blockage in its origin. The entire proximal third of the left SFA was calcified with a long 90% stenosis. There was two-vessel runoff to the ankle. The pericardial artery was occluded. Anterior tibial and posterior tibial arteries were occluded at the ankle.  IMPRESSION:Mr. Fails has critical  limb ischemia secondary to a combination of high-grade calcified segmental proximal mid left SFA stenosis followed by occlusion of his tibial vessels at the level of the ankle. We will  need diamondback orbital or to left neurectomy, PT and stenting of his SFA plus or minus attempt at revascularization of his distal tibial vessels for limb salvage. The patient was given IV hydralazine for blood pressure control. The sheath will be removed pressure will be held on the groin to achieve hemostasis. The patient be hydrated overnight and placed back in step-down.    Lorretta Harp MD, [facility] 12/10/2013 10:22 AM

## 2013-12-10 NOTE — H&P (View-Only) (Signed)
Patient ID: Nicholas Muro Sr., male   DOB: 11/07/1925, 80 y.o.   MRN: [id number]    Subjective:  Denies SSCP, palpitations or Dyspnea Having PV procedure today   Objective:  Filed Vitals:   12/10/13 0100 12/10/13 0340 12/10/13 0400 12/10/13 0744  BP:   166/67 165/51  Pulse:    79  Temp:  98.1 F (36.7 C)  97.7 F (36.5 C)  TempSrc:  Oral  Oral  Resp:   16 23  Height: 5' 10" (1.778 m)     Weight:      SpO2:   99% 96%    Intake/Output from previous day:  Intake/Output Summary (Last 24 hours) at 12/10/13 0846 Last data filed at 12/10/13 0500  Gross per 24 hour  Intake 917.13 ml  Output   1500 ml  Net -582.87 ml    Physical Exam: Affect appropriate Chronically ill white male  HEENT: normal Neck supple with no adenopathy JVP normal no bruits no thyromegaly Lungs clear with no wheezing and good diaphragmatic motion Heart:  S1/S2 SEM murmur, no rub, gallop or click PMI normal Abdomen: benighn, BS positve, no tenderness, no AAA  LFA closed with Minx no bruit.  No HSM or HJR Right AKA  Decreased left pulses with discoloration of toes   No edema Neuro non-focal Skin warm and dry No muscular weakness   Lab Results: Basic Metabolic Panel:  Recent Labs  12/09/13 0430 12/10/13 0253  NA 138 137  K 4.8 4.7  CL 103 101  CO2 21 23  GLUCOSE 123* 84  BUN 26* 24*  CREATININE 1.03 0.90  CALCIUM 9.1 8.9   Liver Function Tests:  Recent Labs  12/09/13 0430  AST 13  ALT 16  ALKPHOS 71  BILITOT 0.3  PROT 7.0  ALBUMIN 3.6   CBC:  Recent Labs  12/09/13 0430 12/10/13 0253  WBC 6.8 5.2  NEUTROABS 4.9  --   HGB 11.3* 10.7*  HCT 33.5* 31.9*  MCV 85.9 86.4  PLT 93* 91*   Cardiac Enzymes:  Recent Labs  12/09/13 0815 12/09/13 1350 12/09/13 1945  TROPONINI 0.35* 0.45* 0.39*   Fasting Lipid Panel:  Recent Labs  12/10/13 0253  CHOL 128  HDL 30*  LDLCALC 74  TRIG 120  CHOLHDL 4.3    Imaging: Dg Chest 2 View  12/09/2013   CLINICAL DATA:  Chest  pain radiating to be  EXAM: CHEST  2 VIEW  COMPARISON:  None.  FINDINGS: There is diffuse interstitial coarsening which is likely chronic. No overt edema. No consolidation. No cardiomegaly. Previous median sternotomy and coronary stenting. No effusion or pneumothorax.  IMPRESSION: No active cardiopulmonary disease.   Electronically Signed   By: Jonathan  Watts M.D.   On: 12/09/2013 05:05    Cardiac Studies:  ECG:    Telemetry:  Echo:   Medications:   . amLODipine  5 mg Oral Daily  . aspirin  81 mg Oral Pre-Cath  . [START ON 12/11/2013] aspirin EC  81 mg Oral Daily  . azithromycin  500 mg Oral Daily  . clopidogrel  75 mg Oral Q breakfast  . digoxin  0.125 mg Oral Daily  . docusate sodium  100 mg Oral BID  . dutasteride  0.5 mg Oral Daily  . famotidine  20 mg Oral Daily  . ferrous sulfate  325 mg Oral QPM  . furosemide  40 mg Oral Daily  . gabapentin  300 mg Oral TID  . lidocaine  1 patch Transdermal Q12H  .   lisinopril  20 mg Oral Daily  . metoprolol  100 mg Oral BID  . rifampin  300 mg Oral Q12H  . sodium chloride  3 mL Intravenous Q12H  . sulfamethoxazole-trimethoprim  1 tablet Oral BID     . sodium chloride 50 mL/hr at 12/10/13 0000  . sodium chloride      Assessment/Plan:  CAD:  Post stenting of SVG to LAD by Dr Fletcher Anon yesterday  No chest pain LFA A with no hematoma  DAT indefinately HTN:  Stable continue ACE and diuretic CHF:  Improved EDP high at cath will check echo in hospital PVD:  For angiogram with JB today not clear why azithromycin and bactrim being used for ? Consider d/c or simplifying to cephalosporin    [name] 12/10/2013, 8:46 AM

## 2013-12-10 NOTE — Progress Notes (Signed)
Patient ID: Nicholas Bash Sr., male   DOB: [birth year], 80 y.o.   MRN: [id number]    Subjective:  Denies SSCP, palpitations or Dyspnea Having PV procedure today   Objective:  Filed Vitals:   12/10/13 0100 12/10/13 0340 12/10/13 0400 12/10/13 0744  BP:   166/67 165/51  Pulse:    79  Temp:  98.1 F (36.7 C)  97.7 F (36.5 C)  TempSrc:  Oral  Oral  Resp:   16 23  Height: 5\' 10"  (1.778 m)     Weight:      SpO2:   99% 96%    Intake/Output from previous day:  Intake/Output Summary (Last 24 hours) at 12/10/13 0846 Last data filed at 12/10/13 0500  Gross per 24 hour  Intake 917.13 ml  Output   1500 ml  Net -582.87 ml    Physical Exam: Affect appropriate Chronically ill white male  HEENT: normal Neck supple with no adenopathy JVP normal no bruits no thyromegaly Lungs clear with no wheezing and good diaphragmatic motion Heart:  S1/S2 SEM murmur, no rub, gallop or click PMI normal Abdomen: benighn, BS positve, no tenderness, no AAA  LFA closed with Minx no bruit.  No HSM or HJR Right AKA  Decreased left pulses with discoloration of toes   No edema Neuro non-focal Skin warm and dry No muscular weakness   Lab Results: Basic Metabolic Panel:  Recent Labs  12/09/13 0430 12/10/13 0253  NA 138 137  K 4.8 4.7  CL 103 101  CO2 21 23  GLUCOSE 123* 84  BUN 26* 24*  CREATININE 1.03 0.90  CALCIUM 9.1 8.9   Liver Function Tests:  Recent Labs  12/09/13 0430  AST 13  ALT 16  ALKPHOS 71  BILITOT 0.3  PROT 7.0  ALBUMIN 3.6   CBC:  Recent Labs  12/09/13 0430 12/10/13 0253  WBC 6.8 5.2  NEUTROABS 4.9  --   HGB 11.3* 10.7*  HCT 33.5* 31.9*  MCV 85.9 86.4  PLT 93* 91*   Cardiac Enzymes:  Recent Labs  12/09/13 0815 12/09/13 1350 12/09/13 1945  TROPONINI 0.35* 0.45* 0.39*   Fasting Lipid Panel:  Recent Labs  12/10/13 0253  CHOL 128  HDL 30*  LDLCALC 74  TRIG 120  CHOLHDL 4.3    Imaging: Dg Chest 2 View  12/09/2013   CLINICAL DATA:  Chest  pain radiating to be  EXAM: CHEST  2 VIEW  COMPARISON:  None.  FINDINGS: There is diffuse interstitial coarsening which is likely chronic. No overt edema. No consolidation. No cardiomegaly. Previous median sternotomy and coronary stenting. No effusion or pneumothorax.  IMPRESSION: No active cardiopulmonary disease.   Electronically Signed   By: Jorje Guild M.D.   On: 12/09/2013 05:05    Cardiac Studies:  ECG:    Telemetry:  Echo:   Medications:   . amLODipine  5 mg Oral Daily  . aspirin  81 mg Oral Pre-Cath  . [START ON 12/11/2013] aspirin EC  81 mg Oral Daily  . azithromycin  500 mg Oral Daily  . clopidogrel  75 mg Oral Q breakfast  . digoxin  0.125 mg Oral Daily  . docusate sodium  100 mg Oral BID  . dutasteride  0.5 mg Oral Daily  . famotidine  20 mg Oral Daily  . ferrous sulfate  325 mg Oral QPM  . furosemide  40 mg Oral Daily  . gabapentin  300 mg Oral TID  . lidocaine  1 patch Transdermal Q12H  .  lisinopril  20 mg Oral Daily  . metoprolol  100 mg Oral BID  . rifampin  300 mg Oral Q12H  . sodium chloride  3 mL Intravenous Q12H  . sulfamethoxazole-trimethoprim  1 tablet Oral BID     . sodium chloride 50 mL/hr at 12/10/13 0000  . sodium chloride      Assessment/Plan:  CAD:  Post stenting of SVG to LAD by Dr Fletcher Anon yesterday  No chest pain LFA A with no hematoma  DAT indefinately HTN:  Stable continue ACE and diuretic CHF:  Improved EDP high at cath will check echo in hospital PVD:  For angiogram with JB today not clear why azithromycin and bactrim being used for ? Consider d/c or simplifying to cephalosporin    [name] 12/10/2013, 8:46 AM

## 2013-12-11 LAB — BASIC METABOLIC PANEL
BUN: 20 mg/dL (ref 6–23)
CALCIUM: 9.2 mg/dL (ref 8.4–10.5)
CO2: 19 mEq/L (ref 19–32)
Chloride: 104 mEq/L (ref 96–112)
Creatinine, Ser: 0.9 mg/dL (ref 0.50–1.35)
GFR calc non Af Amer: 75 mL/min — ABNORMAL LOW (ref >90)
GFR, EST AFRICAN AMERICAN: 87 mL/min — AB (ref >90)
GLUCOSE: 104 mg/dL — AB (ref 70–99)
POTASSIUM: 4.6 meq/L (ref 3.7–5.3)
SODIUM: 139 meq/L (ref 137–147)

## 2013-12-11 LAB — CBC
HCT: 32 % — ABNORMAL LOW (ref 39.0–52.0)
Hemoglobin: 10.7 g/dL — ABNORMAL LOW (ref 13.0–17.0)
MCH: 28.9 pg (ref 26.0–34.0)
MCHC: 33.4 g/dL (ref 30.0–36.0)
MCV: 86.5 fL (ref 78.0–100.0)
Platelets: 96 10*3/uL — ABNORMAL LOW (ref 150–400)
RBC: 3.7 MIL/uL — ABNORMAL LOW (ref 4.22–5.81)
RDW: 15.7 % — ABNORMAL HIGH (ref 11.5–15.5)
WBC: 6.3 10*3/uL (ref 4.0–10.5)

## 2013-12-11 LAB — POCT ACTIVATED CLOTTING TIME: Activated Clotting Time: 138 seconds

## 2013-12-11 NOTE — Progress Notes (Signed)
Patient ID: Nicholas Bash Sr., male   DOB: 06-27-1925, 80 y.o.   MRN: [id number]    Subjective:  Denies SSCP, palpitations or Dyspnea Left foot hurts   Objective:  Filed Vitals:   12/11/13 0345 12/11/13 0400 12/11/13 0451 12/11/13 0800  BP:  179/50 164/71 169/45  Pulse:  79  86  Temp: 98.1 F (36.7 C)   98.5 F (36.9 C)  TempSrc: Oral   Oral  Resp:  18    Height:      Weight:      SpO2:  96%  99%    Intake/Output from previous day:  Intake/Output Summary (Last 24 hours) at 12/11/13 9371 Last data filed at 12/11/13 0813  Gross per 24 hour  Intake   1091 ml  Output   2225 ml  Net  -1134 ml    Physical Exam: Affect appropriate Chronically ill white male  HEENT: normal Neck supple with no adenopathy JVP normal no bruits no thyromegaly Lungs clear with no wheezing and good diaphragmatic motion Heart:  S1/S2 SEM murmur, no rub, gallop or click PMI normal Abdomen: benighn, BS positve, no tenderness, no AAA  LFA closed with Minx no bruit.  No HSM or HJR Right AKA  Decreased left pulses with discoloration of toes   No edema Neuro non-focal Skin warm and dry No muscular weakness   Lab Results: Basic Metabolic Panel:  Recent Labs  12/10/13 0253 12/11/13 0313  NA 137 139  K 4.7 4.6  CL 101 104  CO2 23 19  GLUCOSE 84 104*  BUN 24* 20  CREATININE 0.90 0.90  CALCIUM 8.9 9.2   Liver Function Tests:  Recent Labs  12/09/13 0430  AST 13  ALT 16  ALKPHOS 71  BILITOT 0.3  PROT 7.0  ALBUMIN 3.6   CBC:  Recent Labs  12/09/13 0430 12/10/13 0253 12/11/13 0313  WBC 6.8 5.2 6.3  NEUTROABS 4.9  --   --   HGB 11.3* 10.7* 10.7*  HCT 33.5* 31.9* 32.0*  MCV 85.9 86.4 86.5  PLT 93* 91* 96*   Cardiac Enzymes:  Recent Labs  12/09/13 0815 12/09/13 1350 12/09/13 1945  TROPONINI 0.35* 0.45* 0.39*   Fasting Lipid Panel:  Recent Labs  12/10/13 0253  CHOL 128  HDL 30*  LDLCALC 74  TRIG 120  CHOLHDL 4.3    Imaging: No results found.  Cardiac  Studies:  ECG:  SR old IMI  Anterolateral T wave inversions    Telemetry:  SR no VT/afib  Echo:   Medications:   . amLODipine  5 mg Oral Daily  . aspirin EC  325 mg Oral Daily  . aspirin EC  81 mg Oral Daily  . azithromycin  500 mg Oral Daily  . clopidogrel  75 mg Oral Q breakfast  . digoxin  0.125 mg Oral Daily  . docusate sodium  100 mg Oral BID  . dutasteride  0.5 mg Oral Daily  . famotidine  20 mg Oral Daily  . ferrous sulfate  325 mg Oral QPM  . furosemide  40 mg Oral Daily  . gabapentin  300 mg Oral TID  . lidocaine  1 patch Transdermal Q12H  . lisinopril  20 mg Oral Daily  . metoprolol  100 mg Oral BID  . rifampin  300 mg Oral Q12H  . sulfamethoxazole-trimethoprim  1 tablet Oral BID        Assessment/Plan:  CAD:  Post stenting of SVG to LAD by Dr Fletcher Anon   No  chest pain LFA A with no hematoma  DAT indefinately HTN:  Stable continue ACE and diuretic CHF:  Improved EDP high at cath will check echo in hospital PVD:  For atherectomy Monday with JB.  It will be difficult to establish circulation to the distal vessels confirmed Schedule for 8:00 am Monday orders done ID:  Has myobac aviarum infectoin right hand  Continue azitrhomycin/rifampin and bactrim    [name] 12/11/2013, 9:39 AM

## 2013-12-12 NOTE — Progress Notes (Signed)
Subjective:  Only complaint is left foot pain, otherwise smiling and comfortable  Objective:  Temp:  [97.3 F (36.3 C)-98.5 F (36.9 C)] 97.5 F (36.4 C) (01/17 1549) Resp:  [12-17] 12 (01/17 1549) BP: (124-176)/(40-62) 176/62 mmHg (01/17 1549) SpO2:  [95 %-98 %] 97 % (01/17 1549) Weight change:   Intake/Output from previous day: 01/16 0701 - 01/17 0700 In: 600 [P.O.:600] Out: 1150 [Urine:1150]  Intake/Output from this shift: Total I/O In: 480 [P.O.:480] Out: 1300 [Urine:1300]  Medications: Current Facility-Administered Medications  Medication Dose Route Frequency Provider Last Rate Last Dose  . acetaminophen (TYLENOL) tablet 650 mg  650 mg Oral Q4H PRN Lorretta Harp, MD   650 mg at 12/11/13 2047  . acetaminophen (TYLENOL) tablet 650 mg  650 mg Oral Q4H PRN Lorretta Harp, MD      . alum & mag hydroxide-simeth (MAALOX/MYLANTA) 200-200-20 MG/5ML suspension 15 mL  15 mL Oral Q8H PRN Lorretta Harp, MD      . amLODipine (NORVASC) tablet 5 mg  5 mg Oral Daily Lorretta Harp, MD   5 mg at 12/12/13 0945  . aspirin EC tablet 325 mg  325 mg Oral Daily Lorretta Harp, MD   325 mg at 12/12/13 0944  . azithromycin (ZITHROMAX) tablet 500 mg  500 mg Oral Daily Lorretta Harp, MD   500 mg at 12/12/13 0945  . cloNIDine (CATAPRES) tablet 0.1 mg  0.1 mg Oral TID PRN Lorretta Harp, MD   0.1 mg at 12/12/13 1703  . clopidogrel (PLAVIX) tablet 75 mg  75 mg Oral Q breakfast Lorretta Harp, MD   75 mg at 12/12/13 7741  . digoxin (LANOXIN) tablet 0.125 mg  0.125 mg Oral Daily Wellington Hampshire, MD   0.125 mg at 12/12/13 0944  . docusate sodium (COLACE) capsule 100 mg  100 mg Oral BID Lorretta Harp, MD   100 mg at 12/12/13 0945  . dutasteride (AVODART) capsule 0.5 mg  0.5 mg Oral Daily Lorretta Harp, MD   0.5 mg at 12/12/13 0944  . famotidine (PEPCID) tablet 20 mg  20 mg Oral Daily Lorretta Harp, MD   20 mg at 12/12/13 0944  . fentaNYL (SUBLIMAZE) injection 25 mcg  25  mcg Intravenous Q2H PRN Lorretta Harp, MD   25 mcg at 12/10/13 1819  . ferrous sulfate tablet 325 mg  325 mg Oral QPM Lorretta Harp, MD   325 mg at 12/12/13 1703  . furosemide (LASIX) tablet 40 mg  40 mg Oral Daily Wellington Hampshire, MD   40 mg at 12/12/13 0944  . gabapentin (NEURONTIN) capsule 300 mg  300 mg Oral TID Lorretta Harp, MD   300 mg at 12/12/13 1703  . hydrALAZINE (APRESOLINE) injection 10 mg  10 mg Intravenous PRN Lorretta Harp, MD   10 mg at 12/11/13 0451  . HYDROcodone-acetaminophen (NORCO) 10-325 MG per tablet 1 tablet  1 tablet Oral Q6H PRN Lorretta Harp, MD   1 tablet at 12/12/13 1704  . lidocaine (LIDODERM) 5 % 1 patch  1 patch Transdermal Q12H Lorretta Harp, MD   1 patch at 12/12/13 [phone]  . lisinopril (PRINIVIL,ZESTRIL) tablet 20 mg  20 mg Oral Daily Lorretta Harp, MD   20 mg at 12/12/13 0944  . metoprolol tartrate (LOPRESSOR) tablet 100 mg  100 mg Oral BID Lorretta Harp, MD   100 mg at 12/12/13 0945  . morphine 2  MG/ML injection 1 mg  1 mg Intravenous Q1H PRN Lorretta Harp, MD   1 mg at 12/11/13 1113  . nitroGLYCERIN (NITROSTAT) SL tablet 0.4 mg  0.4 mg Sublingual Q5 Min x 3 PRN Lorretta Harp, MD      . ondansetron [facility]) injection 4 mg  4 mg Intravenous Q6H PRN Lorretta Harp, MD      . ondansetron [facility]) injection 4 mg  4 mg Intravenous Q6H PRN Lorretta Harp, MD      . rifampin (RIFADIN) capsule 300 mg  300 mg Oral Q12H Lorretta Harp, MD   300 mg at 12/12/13 0944  . sulfamethoxazole-trimethoprim (BACTRIM DS) 800-160 MG per tablet 1 tablet  1 tablet Oral BID Lorretta Harp, MD   1 tablet at 12/12/13 0944    Physical Exam: General appearance: alert, cooperative and no distress Neck: no adenopathy, no carotid bruit, no JVD, supple, symmetrical, trachea midline and thyroid not enlarged, symmetric, no tenderness/mass/nodules Lungs: clear to auscultation bilaterally Heart: regular rate and rhythm, S1, S2 normal and systolic murmur:  early systolic 2/6, crescendo and decrescendo at 2nd left intercostal space Abdomen: soft, non-tender; bowel sounds normal; no masses,  no organomegaly Extremities: s/p right AKA, dusky, cool distal 1/3 of left foot, especially 4th toe Pulses: 2+ and symmetric no pulses in left foot Skin: cyanosis left foot Neurologic: Grossly normal  Lab Results: Results for orders placed during the hospital encounter of 12/09/13 (from the past 48 hour(s))  CBC     Status: Abnormal   Collection Time    12/11/13  3:13 AM      Result Value Range   WBC 6.3  4.0 - 10.5 K/uL   RBC 3.70 (*) 4.22 - 5.81 MIL/uL   Hemoglobin 10.7 (*) 13.0 - 17.0 g/dL   HCT 32.0 (*) 39.0 - 52.0 %   MCV 86.5  78.0 - 100.0 fL   MCH 28.9  26.0 - 34.0 pg   MCHC 33.4  30.0 - 36.0 g/dL   RDW 15.7 (*) 11.5 - 15.5 %   Platelets 96 (*) 150 - 400 K/uL   Comment: CONSISTENT WITH PREVIOUS RESULT  BASIC METABOLIC PANEL     Status: Abnormal   Collection Time    12/11/13  3:13 AM      Result Value Range   Sodium 139  137 - 147 mEq/L   Potassium 4.6  3.7 - 5.3 mEq/L   Chloride 104  96 - 112 mEq/L   CO2 19  19 - 32 mEq/L   Glucose, Bld 104 (*) 70 - 99 mg/dL   BUN 20  6 - 23 mg/dL   Creatinine, Ser 0.90  0.50 - 1.35 mg/dL   Calcium 9.2  8.4 - 10.5 mg/dL   GFR calc non Af Amer 75 (*) >90 mL/min   GFR calc Af Amer 87 (*) >90 mL/min   Comment: (NOTE)     The eGFR has been calculated using the CKD EPI equation.     This calculation has not been validated in all clinical situations.     eGFR's persistently <90 mL/min signify possible Chronic Kidney     Disease.    Imaging: Imaging results have been reviewed and No results found.  Assessment:  1. Principal Problem: 2.   Coronary artery disease 3. Active Problems: 4.   Peripheral arterial disease 5.   Hypertension 6.   Unstable angina 7.   CHF (congestive heart failure) 8.   Plan:  1.  CAD appears stable - asymptomatic since PCI 2. BP control is fair 3. No clinical signs  of acute HF 4. Planned PTA Monday for left foot salvage  Time Spent Directly with Patient:  30 minutes  Length of Stay:  LOS: 3 days    Trystyn Sitts 12/12/2013, 6:49 PM

## 2013-12-13 DIAGNOSIS — I517 Cardiomegaly: Secondary | ICD-10-CM

## 2013-12-13 MED ORDER — SODIUM CHLORIDE 0.9 % IV SOLN: 250.0000 mL | INTRAVENOUS | Status: DC | PRN

## 2013-12-13 MED ORDER — ASPIRIN 81 MG PO CHEW
81.0000 mg | CHEWABLE_TABLET | ORAL | Status: AC
  Administered 2013-12-14: 81 mg via ORAL
  Filled 2013-12-13: qty 1

## 2013-12-13 MED ORDER — SODIUM CHLORIDE 0.9 % IJ SOLN
3.0000 mL | Freq: Two times a day (BID) | INTRAMUSCULAR | Status: DC
  Administered 2013-12-13: 3 mL via INTRAVENOUS

## 2013-12-13 MED ORDER — SODIUM CHLORIDE 0.9 % IJ SOLN
3.0000 mL | INTRAMUSCULAR | Status: DC | PRN
Start: 2013-12-13 — End: 2013-12-14

## 2013-12-13 NOTE — Progress Notes (Signed)
  Echocardiogram 2D Echocardiogram has been performed.  Nicholas Henson, [facility] 12/13/2013, 10:29 AM

## 2013-12-13 NOTE — Progress Notes (Signed)
Subjective:  Still has left foot pain that improves with dependency. Had some phantom right limb pain overnight. No cardiac complaints   Objective:  Temp:  [97.5 F (36.4 C)-98.1 F (36.7 C)] 98.1 F (36.7 C) (01/18 0744) Pulse Rate:  [62] 62 (01/18 0744) Resp:  [10-19] 19 (01/18 0800) BP: (100-176)/(38-62) 153/49 mmHg (01/18 1123) SpO2:  [94 %-97 %] 95 % (01/18 0744) Weight change:   Intake/Output from previous day: 01/17 0701 - 01/18 0700 In: 720 [P.O.:720] Out: 1650 [Urine:1650]  Intake/Output from this shift: Total I/O In: 360 [P.O.:360] Out: 300 [Urine:300]  Medications: Current Facility-Administered Medications  Medication Dose Route Frequency Provider Last Rate Last Dose  . acetaminophen (TYLENOL) tablet 650 mg  650 mg Oral Q4H PRN Lorretta Harp, MD   650 mg at 12/11/13 2047  . acetaminophen (TYLENOL) tablet 650 mg  650 mg Oral Q4H PRN Lorretta Harp, MD   650 mg at 12/12/13 2204  . alum & mag hydroxide-simeth (MAALOX/MYLANTA) 200-200-20 MG/5ML suspension 15 mL  15 mL Oral Q8H PRN Lorretta Harp, MD      . amLODipine (NORVASC) tablet 5 mg  5 mg Oral Daily Lorretta Harp, MD   5 mg at 12/13/13 1112  . aspirin EC tablet 325 mg  325 mg Oral Daily Lorretta Harp, MD   325 mg at 12/13/13 1113  . azithromycin (ZITHROMAX) tablet 500 mg  500 mg Oral Daily Lorretta Harp, MD   500 mg at 12/13/13 1111  . cloNIDine (CATAPRES) tablet 0.1 mg  0.1 mg Oral TID PRN Lorretta Harp, MD   0.1 mg at 12/12/13 1703  . clopidogrel (PLAVIX) tablet 75 mg  75 mg Oral Q breakfast Lorretta Harp, MD   75 mg at 12/13/13 7341  . digoxin (LANOXIN) tablet 0.125 mg  0.125 mg Oral Daily Wellington Hampshire, MD   0.125 mg at 12/13/13 1113  . docusate sodium (COLACE) capsule 100 mg  100 mg Oral BID Lorretta Harp, MD   100 mg at 12/13/13 1114  . dutasteride (AVODART) capsule 0.5 mg  0.5 mg Oral Daily Lorretta Harp, MD   0.5 mg at 12/13/13 1120  . famotidine (PEPCID) tablet 20 mg   20 mg Oral Daily Lorretta Harp, MD   20 mg at 12/13/13 1114  . fentaNYL (SUBLIMAZE) injection 25 mcg  25 mcg Intravenous Q2H PRN Lorretta Harp, MD   25 mcg at 12/10/13 1819  . ferrous sulfate tablet 325 mg  325 mg Oral QPM Lorretta Harp, MD   325 mg at 12/12/13 1703  . furosemide (LASIX) tablet 40 mg  40 mg Oral Daily Wellington Hampshire, MD   40 mg at 12/13/13 1113  . gabapentin (NEURONTIN) capsule 300 mg  300 mg Oral TID Lorretta Harp, MD   300 mg at 12/13/13 1112  . hydrALAZINE (APRESOLINE) injection 10 mg  10 mg Intravenous PRN Lorretta Harp, MD   10 mg at 12/11/13 0451  . HYDROcodone-acetaminophen (NORCO) 10-325 MG per tablet 1 tablet  1 tablet Oral Q6H PRN Lorretta Harp, MD   1 tablet at 12/13/13 1115  . lidocaine (LIDODERM) 5 % 1 patch  1 patch Transdermal Q12H Lorretta Harp, MD   1 patch at 12/12/13 2210  . lisinopril (PRINIVIL,ZESTRIL) tablet 20 mg  20 mg Oral Daily Lorretta Harp, MD   20 mg at 12/13/13 1113  . metoprolol tartrate (LOPRESSOR) tablet 100 mg  100 mg Oral BID Lorretta Harp, MD   100 mg at 12/13/13 1115  . morphine 2 MG/ML injection 1 mg  1 mg Intravenous Q1H PRN Lorretta Harp, MD   1 mg at 12/11/13 1113  . nitroGLYCERIN (NITROSTAT) SL tablet 0.4 mg  0.4 mg Sublingual Q5 Min x 3 PRN Lorretta Harp, MD      . ondansetron [facility]) injection 4 mg  4 mg Intravenous Q6H PRN Lorretta Harp, MD      . ondansetron [facility]) injection 4 mg  4 mg Intravenous Q6H PRN Lorretta Harp, MD      . rifampin (RIFADIN) capsule 300 mg  300 mg Oral Q12H Lorretta Harp, MD   300 mg at 12/13/13 1112  . sulfamethoxazole-trimethoprim (BACTRIM DS) 800-160 MG per tablet 1 tablet  1 tablet Oral BID Lorretta Harp, MD   1 tablet at 12/13/13 1112    Physical Exam: General appearance: alert, cooperative and no distress  Neck: no adenopathy, no carotid bruit, no JVD, supple, symmetrical, trachea midline and thyroid not enlarged, symmetric, no tenderness/mass/nodules    Lungs: clear to auscultation bilaterally  Heart: regular rate and rhythm, S1, S2 normal and systolic murmur: early systolic 2/6, crescendo and decrescendo at 2nd left intercostal space  Abdomen: soft, non-tender; bowel sounds normal; no masses, no organomegaly  Extremities: s/p right AKA, dusky, cool distal 1/3 of left foot, especially 4th toe  Pulses: 2+ and symmetric  no pulses in left foot  Skin: cyanosis left foot  Neurologic: Grossly normal  Imaging: No results found.  Assessment:  1. Principal Problem: 2.   Coronary artery disease 3. Active Problems: 4.   Peripheral arterial disease 5.   Hypertension 6.   Unstable angina 7.   CHF (congestive heart failure) 8.   Plan:  1. No symptoms or signs of heart failure or acute coronary insufficiency 2. Severe limb threatening ischemia of left foot for limb salvage PTA attempt tomorrow  Time Spent Directly with Patient:  20 minutes  Length of Stay:  LOS: 4 days    Nicholas Henson 12/13/2013, 11:42 AM

## 2013-12-13 NOTE — Progress Notes (Signed)
I agree with the assessment performed and documented by Mavis, student nurse. 

## 2013-12-14 ENCOUNTER — Encounter (HOSPITAL_COMMUNITY)
Admission: EM | Disposition: A | Discharge status: Home / Self Care | Payer: Self-pay | Source: Home / Self Care | Attending: Cardiovascular Disease

## 2013-12-14 ENCOUNTER — Ambulatory Visit (HOSPITAL_COMMUNITY): Admission: RE | Admit: 2013-12-14 | Payer: Medicare Other | Source: Ambulatory Visit | Admitting: Cardiovascular Disease

## 2013-12-14 DIAGNOSIS — I70219 Atherosclerosis of native arteries of extremities with intermittent claudication, unspecified extremity: Secondary | ICD-10-CM

## 2013-12-14 HISTORY — PX: LOWER EXTREMITY ANGIOGRAM: SHX5508

## 2013-12-14 HISTORY — PX: OTHER SURGICAL HISTORY: SHX169

## 2013-12-14 HISTORY — PX: PERCUTANEOUS STENT INTERVENTION: SHX5500

## 2013-12-14 LAB — POCT ACTIVATED CLOTTING TIME
ACTIVATED CLOTTING TIME: 210 s
ACTIVATED CLOTTING TIME: 177 s
Activated Clotting Time: 232 seconds
Activated Clotting Time: 232 seconds
Activated Clotting Time: 199 seconds
Activated Clotting Time: 0 seconds

## 2013-12-14 SURGERY — ATHERECTOMY PERIPHERAL ARTERY

## 2013-12-14 MED ORDER — VERAPAMIL HCL 2.5 MG/ML IV SOLN
INTRAVENOUS | Status: AC
  Filled 2013-12-14: qty 2

## 2013-12-14 MED ORDER — MORPHINE SULFATE 2 MG/ML IJ SOLN: 2.0000 mg | INTRAMUSCULAR | Status: DC | PRN

## 2013-12-14 MED ORDER — ONDANSETRON HCL 4 MG/2ML IJ SOLN: 4.0000 mg | Freq: Four times a day (QID) | INTRAMUSCULAR | Status: DC | PRN

## 2013-12-14 MED ORDER — HEPARIN (PORCINE) IN NACL 2-0.9 UNIT/ML-% IJ SOLN
INTRAMUSCULAR | Status: AC
  Filled 2013-12-14: qty 1000

## 2013-12-14 MED ORDER — ASPIRIN EC 325 MG PO TBEC
325.0000 mg | DELAYED_RELEASE_TABLET | Freq: Every day | ORAL | Status: DC
  Administered 2013-12-14 – 2013-12-16 (×3): 325 mg via ORAL
  Filled 2013-12-14 (×3): qty 1

## 2013-12-14 MED ORDER — CLOPIDOGREL BISULFATE 75 MG PO TABS
75.0000 mg | ORAL_TABLET | Freq: Every day | ORAL | Status: DC
  Administered 2013-12-15 – 2013-12-16 (×2): 75 mg via ORAL
  Filled 2013-12-14 (×2): qty 1

## 2013-12-14 MED ORDER — HEPARIN SODIUM (PORCINE) 1000 UNIT/ML IJ SOLN
INTRAMUSCULAR | Status: AC
  Filled 2013-12-14: qty 1

## 2013-12-14 MED ORDER — HYDROMORPHONE HCL PF 1 MG/ML IJ SOLN
INTRAMUSCULAR | Status: AC
  Filled 2013-12-14: qty 1

## 2013-12-14 MED ORDER — FENTANYL CITRATE 0.05 MG/ML IJ SOLN
INTRAMUSCULAR | Status: AC
Start: 2013-12-14 — End: 2013-12-14
  Filled 2013-12-14: qty 2

## 2013-12-14 MED ORDER — LIDOCAINE HCL (PF) 1 % IJ SOLN
INTRAMUSCULAR | Status: AC
  Filled 2013-12-14: qty 30

## 2013-12-14 MED ORDER — SODIUM CHLORIDE 0.9 % IV SOLN: INTRAVENOUS | Status: DC

## 2013-12-14 MED ORDER — SODIUM CHLORIDE 0.9 % IV SOLN: INTRAVENOUS | Status: AC

## 2013-12-14 MED ORDER — NITROGLYCERIN IN D5W 200-5 MCG/ML-% IV SOLN
INTRAVENOUS | Status: AC
  Filled 2013-12-14: qty 250

## 2013-12-14 MED ORDER — ACETAMINOPHEN 325 MG PO TABS: 650.0000 mg | ORAL_TABLET | ORAL | Status: DC | PRN

## 2013-12-14 NOTE — Progress Notes (Signed)
I agree with the assessment documented by Fredderick Severance, Stage manager.

## 2013-12-14 NOTE — Progress Notes (Signed)
29fr. Sheath was pullef from LFA and pressure held for 48min. No hematoma and dressing was applied .  Rayford Halsted, RCIS

## 2013-12-14 NOTE — CV Procedure (Signed)
Nicholas Zufall Sr. is a 80 y.o. male    [id number] LOCATION:  FACILITY: [facility]  PHYSICIAN: Quay Burow, M.D. 12-02-1924   DATE OF PROCEDURE:  12/14/2013  DATE OF DISCHARGE:     PV Angiogram/Intervention    History obtained from chart review.This is a 80 y.o. male widowed Caucasian male father of 3 children who we are asked to see because of unstable angina. He recently relocated from [place] to [place] to be closer to his family.he has an extensive past medical history remarkable for coronary artery disease and peripheral arterial disease. He had angioplasty of his heart performed 05/14/84. Following this he had open heart surgery with coronary artery bypass grafting x1/1/96 for what sounds like felt enteroplasty. He had a heart attack 10/17/1999 and again in 11/30/99. He had angioplasty and stent 02/14/03 and 11/02/04. He really has not had any chest pain since that time until last night when he had 3 episodes of nitroglycerin responsive chest pain. He currently is chest pain-free. His history is remarkable for peripheral arterial disease as well status post lower extremity integrate him 07/06/98 with enteroplasty of ischemia right leg 08/24/98. He had right leg bypass 08/26/98 and what sounds like aortobifemoral bypass grafting for an abdominal aortic aneurysm and arterial occlusive disease in 2009. Most recently this past year he had right above-the-knee amputation performed on multiple occasions beginning 05/28/13 with multiple revisions because of poor wound healing the last surgery being 07/21/13. He does have the limb ischemia with Cyanosis of his left first and fourth toes and resting leg pain. He saw a podiatrist last week for this and was referred to a vascular surgeon for further evaluation. He smoked 3-4 packs a day up until 1985 other problems include 2 hypertension. He's never had a stroke. EKG here in the hospital shows lateral T-wave inversion although we do not  have an old EKG. The patient underwent PCI and stenting of a vein graft to the LAD with excellent antigastric result. He does have residual obtuse marginal branch stenosis. Because of the critical limb ischemia and complex anatomy documented by angiography 4 days ago he presents now for percutaneous intervention for limb salvage using diamondback orbital rotational arthrectomy    PROCEDURE DESCRIPTION:   The patient was brought to the second floor [facility] cath lab in the postabsorptive state. He was premedicated with Valium 5 mg by mouth, IV fentanyl and Dilaudid-HP. His left groinwas prepped and shaved in usual sterile fashion. Xylocaine 1% was used for local anesthesia. A 6 French Ansel 45 cm sheath was inserted into the left limb of the aortobifemoral bypass graft antegrade with documentation of wire going down the SFA using standard Seldinger technique.  HEMODYNAMICS:    AO SYSTOLIC/AO DIASTOLIC: 528/41   Angiographic Data/procedure description:   The patient received a total of 8500 units of heparin with an ACT of 199. Total contrast administered to the patient was 147 cc. I was able to get a 14 Rigali along with an cross endhole catheter through the diseased segment into the above-the-knee popliteal artery and exchanged for a Viper Wire. I then placed a NAV 6 distal protection device in the above-the-knee popliteal artery. I performed orbital rotational atherectomy with a 1.5 mm bur throughout the entire calcified proximal and mid third of the left SFA up to 120,000 rpm's. I then performed PTA with a 5 mm x 120 mm long chocolate balloon throughout the entire atherectomized segment. I then angioplasty this segment with a  6 mm x 150 mm long balloon to "prepare the vessel" for a IDEV stent. I deployed a 5.5 mm x 150 mm long ID EV stent throughout the majority of the atherectomized segment and then placed a 6 mm x 40 mm Abbott absolute pro Nitinol self-expanding stent in the very proximal  portion overlapping the previously placed stent. I postdilated the entire stented segment with a 6 mm x 150 mm long balloon. Completion angiography did reveal significant disease at the level of the ankle and the dorsalis pedis and posterior tibial. I then took the cross endhole catheter along with a Rigali wire down the anterior tibial artery to the level of the ankle and crossed the total occlusion with a Rigali wire. I then balloon angioplasty this area with a 2.5 mm x 40 mm long output Fox ST balloon and completed angiography revealing the dorsalis pedis and dorsal pedisand dorsdal  pedal arch to be widely patent. Finally I exchanged the Ansel Sheath over a 0.35  wire for a short 6 [place] sheath.  IMPRESSION:successful diamondback orbital rotational atherectomy, PTA and stent of high grade long segmental calcified proximal and mid left SFA stenosis with diamondback orbital rotational arthrectomy, I DEV stent using distal protection. I did obtain antegrade access to the aortobifemoral graft and finally perform PTA of the dorsalis pedis at the level of the ankle all done for critical limb ischemia.the patient will be treated with stool and had a therapy. The sheath will be removed once the ACT falls below 170 and pressure will be held on the groin to achieve hemostasis. The patient will be hydrated overnight, ambulated tomorrow and most likely discharged on the following day.   Lorretta Harp MD, [facility] 12/14/2013 11:03 AM

## 2013-12-15 LAB — BASIC METABOLIC PANEL
BUN: 36 mg/dL — ABNORMAL HIGH (ref 6–23)
CHLORIDE: 103 meq/L (ref 96–112)
CO2: 21 mEq/L (ref 19–32)
Calcium: 9.1 mg/dL (ref 8.4–10.5)
Creatinine, Ser: 1.24 mg/dL (ref 0.50–1.35)
GFR calc non Af Amer: 51 mL/min — ABNORMAL LOW (ref >90)
GFR, EST AFRICAN AMERICAN: 59 mL/min — AB (ref >90)
Glucose, Bld: 116 mg/dL — ABNORMAL HIGH (ref 70–99)
Potassium: 4.7 mEq/L (ref 3.7–5.3)
SODIUM: 137 meq/L (ref 137–147)

## 2013-12-15 LAB — CBC
HEMATOCRIT: 30 % — AB (ref 39.0–52.0)
HEMOGLOBIN: 10.2 g/dL — AB (ref 13.0–17.0)
MCH: 29.2 pg (ref 26.0–34.0)
MCHC: 34 g/dL (ref 30.0–36.0)
MCV: 86 fL (ref 78.0–100.0)
Platelets: 110 10*3/uL — ABNORMAL LOW (ref 150–400)
RBC: 3.49 MIL/uL — ABNORMAL LOW (ref 4.22–5.81)
RDW: 15.5 % (ref 11.5–15.5)
WBC: 6.4 10*3/uL (ref 4.0–10.5)

## 2013-12-15 NOTE — Progress Notes (Signed)
Transferred to [facility] by wheelchair, stable. Belongings with pt. Report given to RN

## 2013-12-15 NOTE — Progress Notes (Signed)
Clinical Social Work Department BRIEF PSYCHOSOCIAL ASSESSMENT 12/15/2013  Patient:  Nicholas Henson, Nicholas Henson     Account Number:  [id number]     Admit date:  12/09/2013  Clinical Social Worker:  Freeman Caldron  Date/Time:  12/15/2013 04:13 PM  Referred by:  Physician  Date Referred:  12/15/2013 Referred for  ALF Placement   Other Referral:   Interview type:  Patient Other interview type:    PSYCHOSOCIAL DATA Living Status:  FACILITY Admitted from facility:  [place], [place] Level of care:  Assisted Living Primary support name:  Everson Mott ([phone]) Primary support relationship to patient:  CHILD, ADULT Degree of support available:   Good--pt states he has many friends at facility and that his son and daughter visit him at ALF.    CURRENT CONCERNS Current Concerns  Post-Acute Placement   Other Concerns:    SOCIAL WORK ASSESSMENT / PLAN CSW spoke with pt, asking if he was from [facility]; pt confirmed. CSW asked about pt's opinion of the facility, and pt states he likes it there--he has a son and daughter and friends at the ALF. CSW explained role in discharge and pt expressed understanding.   Assessment/plan status:  Psychosocial Support/Ongoing Assessment of Needs Other assessment/ plan:   Information/referral to community resources:   ALF [facility]).    PATIENT'S/FAMILY'S RESPONSE TO PLAN OF CARE: Good--pt friendly and engaged in conversation with CSW. Understanding of CSW role.       Ky Barban, MSW, [facility] Clinical Social Worker [phone]

## 2013-12-15 NOTE — Progress Notes (Signed)
[id number] Cardiac Rehab Pt does not ambulate, he has AKA right leg. Pt uses wheelchair to get around. They are going to start the process of trying to shrink the stump to get prothesis. He lives in [place] at [facility]. I did MI, stent and CHF education with pt. He voices understanding. I gave him MI booklet, CHF zones and stent booklet and cards. Pt is not appro for Outpt. CRP due to no prothesis. Cardiac Rehab goals met, will sign off. Deon Pilling, RN 12/15/2013 12:32 PM

## 2013-12-15 NOTE — Progress Notes (Signed)
Patient ID: Nicholas Bash Sr., male   DOB: 07/04/1925, 80 y.o.   MRN: [id number]   SUBJECTIVE: No complaints this morning.  Foot slightly sore but much better compared to yesterday.   Nicholas Henson amLODipine  5 mg Oral Daily  . aspirin EC  325 mg Oral Daily  . azithromycin  500 mg Oral Daily  . clopidogrel  75 mg Oral Q breakfast  . digoxin  0.125 mg Oral Daily  . docusate sodium  100 mg Oral BID  . dutasteride  0.5 mg Oral Daily  . famotidine  20 mg Oral Daily  . ferrous sulfate  325 mg Oral QPM  . furosemide  40 mg Oral Daily  . gabapentin  300 mg Oral TID  . lidocaine  1 patch Transdermal Q12H  . lisinopril  20 mg Oral Daily  . metoprolol  100 mg Oral BID  . rifampin  300 mg Oral Q12H  . sulfamethoxazole-trimethoprim  1 tablet Oral BID      Filed Vitals:   12/15/13 0600 12/15/13 0743 12/15/13 0911 12/15/13 0912  BP: 139/48 146/39 114/46   Pulse: 69 67  81  Temp:  97.8 F (36.6 C)    TempSrc:  Oral    Resp: 17 20    Height:      Weight:      SpO2: 96% 96%      Intake/Output Summary (Last 24 hours) at 12/15/13 1007 Last data filed at 12/15/13 0900  Gross per 24 hour  Intake 1141.25 ml  Output    700 ml  Net 441.25 ml    LABS: Basic Metabolic Panel:  Recent Labs  12/15/13 0237  NA 137  K 4.7  CL 103  CO2 21  GLUCOSE 116*  BUN 36*  CREATININE 1.24  CALCIUM 9.1   Liver Function Tests: No results found for this basename: AST, ALT, ALKPHOS, BILITOT, PROT, ALBUMIN,  in the last 72 hours No results found for this basename: LIPASE, AMYLASE,  in the last 72 hours CBC:  Recent Labs  12/15/13 0237  WBC 6.4  HGB 10.2*  HCT 30.0*  MCV 86.0  PLT 110*   Cardiac Enzymes: No results found for this basename: CKTOTAL, CKMB, CKMBINDEX, TROPONINI,  in the last 72 hours BNP: No components found with this basename: POCBNP,  D-Dimer: No results found for this basename: DDIMER,  in the last 72 hours Hemoglobin A1C: No results found for this basename: HGBA1C,  in the last  72 hours Fasting Lipid Panel: No results found for this basename: CHOL, HDL, LDLCALC, TRIG, CHOLHDL, LDLDIRECT,  in the last 72 hours Thyroid Function Tests: No results found for this basename: TSH, T4TOTAL, FREET3, T3FREE, THYROIDAB,  in the last 72 hours Anemia Panel: No results found for this basename: VITAMINB12, FOLATE, FERRITIN, TIBC, IRON, RETICCTPCT,  in the last 72 hours  RADIOLOGY: Dg Chest 2 View  12/09/2013   CLINICAL DATA:  Chest pain radiating to be  EXAM: CHEST  2 VIEW  COMPARISON:  None.  FINDINGS: There is diffuse interstitial coarsening which is likely chronic. No overt edema. No consolidation. No cardiomegaly. Previous median sternotomy and coronary stenting. No effusion or pneumothorax.  IMPRESSION: No active cardiopulmonary disease.   Electronically Signed   By: Jorje Guild M.D.   On: 12/09/2013 05:05    PHYSICAL EXAM General: NAD Neck: No JVD, no thyromegaly or thyroid nodule.  Lungs: Clear to auscultation bilaterally with normal respiratory effort. CV: Nondisplaced PMI.  Heart regular S1/S2, no S3/S4, 2/6  SEM RUSB.  No left ankle edema. 2+ left DP pulse.   Abdomen: Soft, nontender, no hepatosplenomegaly, no distention.  Neurologic: Alert and oriented x 3.  Psych: Normal affect. Extremities: s/p right AKA.  Left foot with mild bluish discoloration of 1st and 4th toes, per patient this is much improved.  Left groin cath site benign.    TELEMETRY: Reviewed telemetry pt in [facility]  ASSESSMENT AND PLAN: 80 yo with history of CAD, HTN, PAD s/p right AKA presented with ischemic right foot.  Yesterday had PTCA/atherectomy/stenting left SFA.  Doing well today, can feel left DP pulse and foot looks better.  Plan to watch patient today, work with PT, then home tomorrow morning (per discussion with Dr. Gwenlyn Found).  He can go to telemetry.   Of note, he is on an unusual antibiotic regimen.  See pharmacy note: it is for MAI treatment and cellulitis.  Continue as planned by outpatient  ID MD.   [name] 12/15/2013 10:10 AM

## 2013-12-16 ENCOUNTER — Encounter (HOSPITAL_COMMUNITY): Payer: Self-pay | Admitting: Physician Assistant

## 2013-12-16 ENCOUNTER — Other Ambulatory Visit: Payer: Self-pay | Admitting: Physician Assistant

## 2013-12-16 DIAGNOSIS — I739 Peripheral vascular disease, unspecified: Secondary | ICD-10-CM

## 2013-12-16 DIAGNOSIS — I200 Unstable angina: Secondary | ICD-10-CM

## 2013-12-16 LAB — BASIC METABOLIC PANEL
BUN: 38 mg/dL — ABNORMAL HIGH (ref 6–23)
CO2: 21 meq/L (ref 19–32)
Calcium: 9.4 mg/dL (ref 8.4–10.5)
Chloride: 101 mEq/L (ref 96–112)
Creatinine, Ser: 1.33 mg/dL (ref 0.50–1.35)
GFR calc Af Amer: 54 mL/min — ABNORMAL LOW (ref >90)
GFR, EST NON AFRICAN AMERICAN: 47 mL/min — AB (ref >90)
Glucose, Bld: 120 mg/dL — ABNORMAL HIGH (ref 70–99)
POTASSIUM: 4.8 meq/L (ref 3.7–5.3)
SODIUM: 137 meq/L (ref 137–147)

## 2013-12-16 LAB — CBC
HCT: 29.6 % — ABNORMAL LOW (ref 39.0–52.0)
Hemoglobin: 9.9 g/dL — ABNORMAL LOW (ref 13.0–17.0)
MCH: 28.9 pg (ref 26.0–34.0)
MCHC: 33.4 g/dL (ref 30.0–36.0)
MCV: 86.5 fL (ref 78.0–100.0)
Platelets: 115 10*3/uL — ABNORMAL LOW (ref 150–400)
RBC: 3.42 MIL/uL — AB (ref 4.22–5.81)
RDW: 15.3 % (ref 11.5–15.5)
WBC: 5.9 10*3/uL (ref 4.0–10.5)

## 2013-12-16 MED ORDER — ASPIRIN EC 81 MG PO TBEC: 81.0000 mg | DELAYED_RELEASE_TABLET | Freq: Every day | ORAL | Status: AC

## 2013-12-16 MED ORDER — RIFAMPIN 300 MG PO CAPS: 300.0000 mg | ORAL_CAPSULE | Freq: Two times a day (BID) | ORAL | Status: DC

## 2013-12-16 MED ORDER — CLOPIDOGREL BISULFATE 75 MG PO TABS: 75.0000 mg | ORAL_TABLET | Freq: Every day | ORAL | Status: DC

## 2013-12-16 MED ORDER — CLOPIDOGREL BISULFATE 75 MG PO TABS: 75.0000 mg | ORAL_TABLET | Freq: Every day | ORAL | Status: AC

## 2013-12-16 MED ORDER — FUROSEMIDE 40 MG PO TABS
40.0000 mg | ORAL_TABLET | Freq: Every day | ORAL | Status: DC
Start: 2013-12-16 — End: 2013-12-16

## 2013-12-16 MED ORDER — FUROSEMIDE 40 MG PO TABS
40.0000 mg | ORAL_TABLET | Freq: Every day | ORAL | Status: AC
Start: 2013-12-16 — End: ?

## 2013-12-16 MED ORDER — ATORVASTATIN CALCIUM 40 MG PO TABS: 40.0000 mg | ORAL_TABLET | Freq: Every day | ORAL | Status: DC

## 2013-12-16 NOTE — Discharge Summary (Signed)
Discharge Summary   Patient ID: Nicholas Henson Sr. MRN: [id number], DOB/AGE: 03-06-1925 80 y.o. Admit date: 12/09/2013 D/C date:     12/16/2013  Primary Cardiologist:   Principal Problem:   Coronary artery disease Active Problems:   Peripheral arterial disease   Hypertension   CHF (congestive heart failure)  Studies- ECHO, cardiac catheterization, PV angiogram  Hospital Course: Mr. Avakian is an 80 y.o. male with a complex medical history who presented to [facility] from his assisted living at [facility] on 12/09/13 with unstable angina. He recently relocated from [place], [place] to [place] to be closer to his family. He has an extensive past medical history remarkable for hypertension, coronary artery disease and peripheral arterial disease. He had angioplasty in 1985, CABG in 1996, myocardial infarctions in 2000 and again in 2001, and PCTA/stents in 2004 and 2005. He has extensive peripheral arterial disease and is s/p above-the-knee amputation performed on multiple occasions beginning 7/14 with multiple revisions because of poor wound healing. The last surgery was in 8/14. He smoked 3-4 packs a day up until 1985. He does not have DM or history of CVAs. Patient presented with chest pain that was relieved by NTG, troponin .39, and EKG in the hospital showed lateral T-wave inversions. With his long history of CAD, symptoms of unstable angina, mildly elevated troponin and EKG changes it was elected to proceed with cardiac catheterization. He underwent cath on 12/09/13 which revealed severe three-vessel coronary artery disease with chronically occluded LAD and RCA, occluded SVG to RCA but the vessel gives collaterals from the left circumflex, the SVG to LAD is patent but has severe 99% hazy stenosis in the midsegment, there is also severe disease in proximal OM. It also showed severely reduced LV systolic function with moderately to severely elevated left  ventricular end-diastolic pressure. Successful drug-eluting stent placement to the SVG to LAD and slow flow was treated successfully with intra coronary adenosine. He was put on DAPT, which he will ideally continue indefinitely if possible.  Additionally, Mr. Stillings also presented with left lower leg rest pain and cyanosis. He had critical limb ischemia for which he underwent a PV angiogram on 12/10/13, which revealed that the ischemia was secondary to a combination of high-grade calcified segmental proximal mid left SFA stenosis followed by occlusion of his tibial vessels at the level of the ankle. On 12/14/13 he underwent PV procedure with successful diamondback orbital rotational atherectomy, PTA and stent of high grade long segmental calcified proximal and mid left SFA stenosis with diamondback orbital rotational arthrectomy, I DEV stent using distal protection and PTA of the dorsalis pedis at the level of the ankle. He was then observe in the hospital and has been recovering well.  Patient is doing well today. He still has leg pain but it is improved. He has excellent DP pulse on the left and the right stump is clean and dry. He has no chest pain. He has been seen and examined by Dr. Martinique today who deemed him ready for discharge back to his assisted living. An appointment has been scheduled with Dr. Gwenlyn Found in 2 weeks. He recently moved to [place] and has not established with primary care here. He reports that his daughter-in-law worked in [place] and has plans to get him a primary care here.  He is to stay on dual antiplatelet therapy with ASA and Plavix indefinitely. Patient had been taken off ASA after his AKA revision last year. He has been  instructed to resume this. He also was not on a statin for some unclear reason. He will be started on Lipitor 40 due to his older age. He is on multiple antibiotics for MAI/cellulitis of right hand and was told he would need to continue until May. We contacted  [facility] infectious disease and they will call to schedule an appointment with him. He will continue Lasix, Dig, ACEi, and metoprolol for severe LV dysfunction. Statin was added for CAD.   Discharge Vitals: Blood pressure 148/58, pulse 79, temperature 97.7 F (36.5 C), temperature source Oral, resp. rate 16, height 5\' 10"  (1.778 m), weight 141 lb 1.5 oz (64 kg), SpO2 98.00%.  Labs: Lab Results  Component Value Date   WBC 5.9 12/16/2013   HGB 9.9* 12/16/2013   HCT 29.6* 12/16/2013   MCV 86.5 12/16/2013   PLT 115* 12/16/2013     Recent Labs Lab 12/16/13 0620  NA 137  K 4.8  CL 101  CO2 21  BUN 38*  CREATININE 1.33  CALCIUM 9.4  GLUCOSE 120*    Lab Results  Component Value Date   CHOL 128 12/10/2013   HDL 30* 12/10/2013   LDLCALC 74 12/10/2013   TRIG 120 12/10/2013     Diagnostic Studies/Procedures   Cardiac Catheterization Procedure Note  12/09/13 Procedure: Left Heart Cath, Selective Coronary Angiography, SVG angiography LV angiography, direct stenting of SVG to LAD with a drug-eluting stent.  Indication: Non-ST elevation myocardial infarction.  Medications:  Sedation: 1 mg IV Versed, 100 mcg IV Fentanyl  Contrast: 160 mL Omnipaque  Diagnostic Procedure Details: The left groin was prepped, draped, and anesthetized with 1% lidocaine. A micropuncture needle was used to engage the left common femoral artery. However, the entry site was above the aortobifem bypass. Thus, the wire could not be advanced. I decided to use ultrasound guidance to visualize the graft and directly gain access in the graft with a micropuncture needle. A micropuncture sheath was placed and then was replaced into a 6 French sheath. Standard Judkins catheters were used for selective coronary angiography and left ventriculography. Catheter exchanges were performed over a wire. The diagnostic procedure was well-tolerated without immediate complications.  Procedural Findings:  Hemodynamics:  AO: 164/64 mmHg  LV:  166/5 mmHg  LVEDP: 31 mmHg  Coronary angiography:  Coronary dominance: Right  Left Main: Mildly calcified with 30% mid eccentric plaque.  Left Anterior Descending (LAD): Severely calcified and occluded in the midsegment.  Circumflex (LCx): Mildly calcified with 20% ostial disease.  1st obtuse marginal: Normal in size with 95% proximal stenosis.  2nd obtuse marginal: Normal in size with minor irregularities.  3rd obtuse marginal: Normal in size with no significant disease.  Right Coronary Artery: Normal in size and occluded in the midsegment. The vessel gives collaterals from the left circumflex.  SVG to LAD: There is a 99% hazy stenosis in the midsegment just before the previously placed stent. This stent is patent with 20% in-stent restenosis. Distally, there is a 40% stenosis.  SVG to RCA: Occluded at the ostium. Left ventriculography: Left ventricular systolic function is severely reduced , LVEF is estimated at 25-30 %, there is moderate mitral regurgitation . There is significant mid to distal anterior wall hypokinesis and moderate basal inferior wall hypokinesis.  PCI Procedure Note: Following the diagnostic procedure, the decision was made to proceed with PCI. I discussed the case with Dr. Quay Burow. This is clearly a high-risk PCI. The sheath was upsized to a 6 [place]. Weight-based bivalirudin was given  for anticoagulation. Once a therapeutic ACT was achieved, a 6 [place] JR 4 guide catheter was inserted. A run through coronary guidewire was used to cross the lesion. A 5 mm spider fx distal protection device was placed distal to the stenosis. The lesion was then stented with a 4.0 x 15 mm Xience Expedition drug-eluting stent. The distal protection device was retrieved. Following PCI, there was 0% residual stenosis and TIMI-2 flow. This was treated successfully with a total of 480 mcg of adenosine in the graft and repeated doses and 400 mcg of nitroglycerin. Final angiography confirmed an  excellent result. Femoral hemostasis was achieved with a Mynx closure device. The patient tolerated the PCI procedure well. There were no immediate procedural complications. The patient was transferred to the post catheterization recovery area for further monitoring.  PCI Data:  Vessel - SVG to LAD/Segment - mid  Percent Stenosis (pre) 99%  TIMI-flow 3  Stent 4.0 x 15 mm Xience Expedition drug-eluting stent  Percent Stenosis (post) 0%  TIMI-flow (post) 3  Final Conclusions:  1. Severe three-vessel coronary artery disease with chronically occluded LAD and RCA. Occluded SVG to RCA but the vessel gives collaterals from the left circumflex. The SVG to LAD is patent but has severe 99% hazy stenosis in the midsegment. There is also severe disease in proximal OM 1  2. Severely reduced LV systolic function with moderately to severely elevated left ventricular end-diastolic pressure  3. Successful drug-eluting stent placement to the SVG to LAD. Slow flow was treated successfully with intra coronary adenosine.  Recommendations:  Continue dual antiplatelet therapy indefinitely if possible. Optimize heart failure treatment. Gentle diuresis. OM1 PCI can be considered for symptom  PV Angiogram/Intervention  12/10/13 History obtained from chart review.This is a 80 y.o. male widowed Caucasian male father of 3 children who we are asked to see because of unstable angina. He recently relocated from [place] to [place] to be closer to his family.he has an extensive past medical history remarkable for coronary artery disease and peripheral arterial disease. He had angioplasty of his heart performed 05/14/84. Following this he had open heart surgery with coronary artery bypass grafting x1/1/96 for what sounds like felt enteroplasty. He had a heart attack 10/17/1999 and again in 11/30/99. He had angioplasty and stent 02/14/03 and 11/02/04. He really has not had any chest pain since that time until  last night when he had 3 episodes of nitroglycerin responsive chest pain. He currently is chest pain-free. His history is remarkable for peripheral arterial disease as well status post lower extremity integrate him 07/06/98 with enteroplasty of ischemia right leg 08/24/98. He had right leg bypass 08/26/98 and what sounds like aortobifemoral bypass grafting for an abdominal aortic aneurysm and arterial occlusive disease in 2009. Most recently this past year he had right above-the-knee amputation performed on multiple occasions beginning 05/28/13 with multiple revisions because of poor wound healing the last surgery being 07/21/13. He does have the limb ischemia with Cyanosis of his left first and fourth toes and resting leg pain. He saw a podiatrist last week for this and was referred to a vascular surgeon for further evaluation. He smoked 3-4 packs a day up until 1985 other problems include 2 hypertension. He's never had a stroke. EKG here in the hospital shows lateral T-wave inversion although we do not have an old EKG. The patient underwent cardiac catheterization yesterday by Dr. Jessie Foot revealing a 99% stenosis within the vein graft to the LAD which underwent percutaneous  recatheterization with a drug-eluting stent. Because of ongoing foot pain secondary to critical limb ischemia, the patient presents now for peripheral angiography to define his anatomy  PROCEDURE DESCRIPTION:  The patient was brought to the second floor [facility] cath lab in the postabsorptive state. He was premedicated with fentanyl IV. His left groinwas prepped and shaved in usual sterile fashion. Xylocaine 1% was used  for local anesthesia. A 5 French sheath was inserted into the left limb of the aortobifemoral bypass graft using standard Seldinger technique. A 5 French pigtail catheter was placed in the distal WR a period abdominal aortography, iliac angiography the left lower extremity angiography was performed using digital  subtraction bolus chase the table technique. Visipaque dye was used for the entirety of the case (83 cc administered the patient). Retrograde aortic pressure was monitored during the case.  HEMODYNAMICS:  AO SYSTOLIC/AO DIASTOLIC: [id number]  Angiographic Data:  1: Abdominal aortogram-the right limb of the aortobifemoral bypass graft was occluded at its origin.  2: Left lower extremity-the anastomosis of the aortobifem to the left common femoral artery was widely patent. The left profunda femoris artery with 190% blockage in its origin. The entire proximal third of the left SFA was calcified with a long 90% stenosis. There was two-vessel runoff to the ankle. The pericardial artery was occluded. Anterior tibial and posterior tibial arteries were occluded at the ankle.  IMPRESSION:Mr. Lewi has critical limb ischemia secondary to a combination of high-grade calcified segmental proximal mid left SFA stenosis followed by occlusion of his tibial vessels at the level of the ankle. We will need diamondback orbital or to left neurectomy, PT and stenting of his SFA plus or minus attempt at revascularization of his distal tibial vessels for limb salvage. The patient was given IV hydralazine for blood pressure control. The sheath will be removed pressure will be held on the groin to achieve hemostasis. The patient be hydrated overnight and placed back in step-down.  2D ECHO Study Date: 12/13/2013 ------------------------------------------------------------ LV EF: 50% - 55% ------------------------------------------------------------ Study Conclusions - Left ventricle: The cavity size was normal. Wall thickness was increased in a pattern of moderate LVH. Systolic function was normal. The estimated ejection fraction was in the range of 50% to 55%. There was an increased relative contribution of atrial contraction to ventricular filling. - Left atrium: The atrium was mildly dilated. - Right atrium: The atrium  was mildly dilated. Impressions: - The quality of this echo is very poor and accurate assessment of the LV function is not possible with this echo. a repeat limited echo with Definity contrast would help visualize the LV endocardium. ------------------------------------------------------------ ------------------------------------------------------------ Left ventricle: The cavity size was normal. Wall thickness was increased in a pattern of moderate LVH. Systolic function was normal. The estimated ejection fraction was in the range of 50% to 55%. There was an increased relative contribution of atrial contraction to ventricular filling. ----------------------------------------------------------- Aortic valve: Doppler: There was no stenosis. No significant regurgitation. ------------------------------------------------------------ Aorta: Aortic root: The aortic root was normal in size. Ascending aorta: The ascending aorta was normal in size. ------------------------------------------------------------ Mitral valve: Doppler: No significant regurgitation. Peak gradient: 12mm Hg (D).Hs ------------------------------------------------------------ Left atrium: The atrium was mildly dilated. ------------------------------------------------------------ Right ventricle: The cavity size was normal. Systolic function was normal. ------------------------------------------------------------ Pulmonic valve: Poorly visualized. Doppler: No significant regurgitation. ------------------------------------------------------------ Tricuspid valve: Poorly visualized. Doppler: No significant regurgitation. ----------------------------------------------------------- Right atrium: The atrium was mildly dilated. ------------------------------------------------------------ Pericardium: There was no pericardial effusion. ------------------------------------------------------------ Systemic veins: Inferior  vena cava:  The vessel was normal in size; the respirophasic diameter changes were in the normal range (= 50%); findings are consistent with normal central venous pressure.   Dg Chest 2 View  12/09/2013   CLINICAL DATA:  Chest pain radiating to be  EXAM: CHEST  2 VIEW  COMPARISON:  None.  FINDINGS: There is diffuse interstitial coarsening which is likely chronic. No overt edema. No consolidation. No cardiomegaly. Previous median sternotomy and coronary stenting. No effusion or pneumothorax.  IMPRESSION: No active cardiopulmonary disease.     Discharge Medications     Medication List         amLODipine 5 MG tablet  Commonly known as:  NORVASC  Take 5 mg by mouth daily.     aspirin EC 81 MG tablet  Take 1 tablet (81 mg total) by mouth daily.     atorvastatin 40 MG tablet  Commonly known as:  LIPITOR  Take 1 tablet (40 mg total) by mouth daily.     azithromycin 250 MG tablet  Commonly known as:  ZITHROMAX  Take 500 mg by mouth daily. 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  This treatment is for 1 year ending 03/26/14.     cloNIDine 0.1 MG tablet  Commonly known as:  CATAPRES  Take 0.1 mg by mouth 3 (three) times daily as needed (if sbp >175).     clopidogrel 75 MG tablet  Commonly known as:  PLAVIX  Take 1 tablet (75 mg total) by mouth daily with breakfast.     digoxin 0.25 MG tablet  Commonly known as:  LANOXIN  Take 0.25 mg by mouth daily.     docusate sodium 100 MG capsule  Commonly known as:  COLACE  Take 100 mg by mouth 2 (two) times daily.     dutasteride 0.5 MG capsule  Commonly known as:  AVODART  Take 0.5 mg by mouth daily.     ferrous sulfate 325 (65 FE) MG tablet  Take 325 mg by mouth every evening.     furosemide 40 MG tablet  Commonly known as:  LASIX  Take 1 tablet (40 mg total) by mouth daily.     gabapentin 300 MG capsule  Commonly known as:  NEURONTIN  Take 300 mg by mouth 3 (three) times daily.     HYDROcodone-acetaminophen  10-325 MG per tablet  Commonly known as:  NORCO  Take 1 tablet by mouth every 6 (six) hours as needed for moderate pain or severe pain.     lidocaine 5 %  Commonly known as:  LIDODERM  Place 1 patch onto the skin every 12 (twelve) hours. Apply 1 patch to right lower extremity stump, leave on for 12 hours and remove.     lisinopril 20 MG tablet  Commonly known as:  PRINIVIL,ZESTRIL  Take 20 mg by mouth daily.     metoprolol 100 MG tablet  Commonly known as:  LOPRESSOR  Take 100 mg by mouth 2 (two) times daily.     multivitamin with minerals Tabs tablet  Take 1 tablet by mouth daily.     MYLANTA 200-200-20 MG/5ML suspension  Generic drug:  alum & mag hydroxide-simeth  Take 15 mLs by mouth every 8 (eight) hours as needed for indigestion or heartburn.     nitroGLYCERIN 0.4 MG SL tablet  Commonly known as:  NITROSTAT  Place 0.4 mg under the tongue every 5 (five) minutes as needed for chest pain.     ranitidine 150 MG tablet  Commonly known as:  ZANTAC  Take 150 mg by mouth 2 (two) times daily.     rifampin 300 MG capsule  Commonly known as:  RIFADIN  Take 1 capsule (300 mg total) by mouth 2 (two) times daily.     sulfamethoxazole-trimethoprim 800-160 MG per tablet  Commonly known as:  BACTRIM DS  Take 1 tablet by mouth 2 (two) times daily. 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  Treatment ending 03/26/14     vitamin C 500 MG tablet  Commonly known as:  ASCORBIC ACID  Take 500 mg by mouth 2 (two) times daily.     zinc sulfate 220 MG capsule  Take 220 mg by mouth daily.        Disposition   The patient will be discharged in stable condition to his assisted living, [facility]. Discharge Orders   Future Appointments Provider Department Dept Phone   01/27/2014 3:00 PM Lorretta Harp, MD [facility] [phone]   Future Orders Complete By Expires   Diet - low sodium heart healthy  As directed    Increase activity slowly  As  directed      Follow-up Information   Follow up with [facility]. (They will call you with an appointment. It is very important to establish care with infectious disease in [place])       Follow up with No PCP Per Patient. (please make an appointment with a primary care provider that is local .)    Specialty:  General Practice   Contact information:   [place] [address] [phone]         Duration of Discharge Encounter: Greater than 30 minutes including physician and PA time.  SignedVertell Limber, Roizy Harold PA-C 12/16/2013, 11:19 AM

## 2013-12-16 NOTE — Discharge Summary (Signed)
Patient seen and examined and history reviewed. Agree with above findings and plan. See earlier rounding note.   Peter JordanMD 12/16/2013 1:41 PM    

## 2013-12-16 NOTE — Progress Notes (Signed)
DC IV, DC Tele,DC Home. Discharge instructions and home medications discussed with patient and patient's son. Patient and son denied any questions or concerns at this time. Patient leaving unit via wheelchair and appears in no acute distress.

## 2013-12-16 NOTE — Progress Notes (Signed)
I, Ilea Hilton A, RN cosign student RN Laura Caldwell's med administration, intake and output, assessment, etc. For this shift. 

## 2013-12-16 NOTE — Discharge Instructions (Signed)
POST FEMORAL ARTERY HEART CATHETERIZATION NO HEAVY LIFTING OR SEXUAL ACTIVITY X 7 DAYS. NO DRIVING X 2-3 DAYS. NO SOAKING BATHS, HOT TUBS, POOLS, ETC, X 7 DAYS.

## 2013-12-16 NOTE — Progress Notes (Signed)
Patient Name: Nicholas Freiermuth Sr. Date of Encounter: 12/16/2013     Principal Problem:   Coronary artery disease Active Problems:   Peripheral arterial disease   Hypertension   Unstable angina   CHF (congestive heart failure)    SUBJECTIVE  Patient is feeling well post procedure.  CURRENT MEDS . amLODipine  5 mg Oral Daily  . aspirin EC  325 mg Oral Daily  . azithromycin  500 mg Oral Daily  . clopidogrel  75 mg Oral Q breakfast  . digoxin  0.125 mg Oral Daily  . docusate sodium  100 mg Oral BID  . dutasteride  0.5 mg Oral Daily  . famotidine  20 mg Oral Daily  . ferrous sulfate  325 mg Oral QPM  . furosemide  40 mg Oral Daily  . gabapentin  300 mg Oral TID  . lidocaine  1 patch Transdermal Q12H  . lisinopril  20 mg Oral Daily  . metoprolol  100 mg Oral BID  . rifampin  300 mg Oral Q12H  . sulfamethoxazole-trimethoprim  1 tablet Oral BID    OBJECTIVE  Filed Vitals:   12/15/13 1622 12/15/13 2114 12/15/13 2210 12/16/13 0524  BP: 117/72 122/58 118/74 125/57  Pulse: 76 80 79 64  Temp: 98.7 F (37.1 C)  98 F (36.7 C) 97.7 F (36.5 C)  TempSrc: Oral  Oral Oral  Resp: 16  17 16   Height:      Weight:    141 lb 1.5 oz (64 kg)  SpO2: 93%  94% 98%    Intake/Output Summary (Last 24 hours) at 12/16/13 0829 Last data filed at 12/16/13 0818  Gross per 24 hour  Intake    830 ml  Output   1100 ml  Net   -270 ml   Filed Weights   12/09/13 0800 12/16/13 0524  Weight: 140 lb 14 oz (63.9 kg) 141 lb 1.5 oz (64 kg)    PHYSICAL EXAM  General: NAD  Neck: No JVD, no thyromegaly or thyroid nodule.  Lungs: Clear to auscultation bilaterally with normal respiratory effort.  CV: Nondisplaced PMI. Heart regular S1/S2, no S3/S4, 2/6 SEM RUSB. No left ankle edema. 2+ left DP pulse.  Abdomen: Soft, nontender, no hepatosplenomegaly, no distention.  Neurologic: Alert and oriented x 3.  Psych: Normal affect.  Extremities: s/p right AKA. Left foot with mild bluish discoloration of  1st and 4th toes, per patient this is much improved. Left groin cath site benign.  Accessory Clinical Findings  CBC  Recent Labs  12/15/13 0237 12/16/13 0620  WBC 6.4 5.9  HGB 10.2* 9.9*  HCT 30.0* 29.6*  MCV 86.0 86.5  PLT 110* 427*   Basic Metabolic Panel  Recent Labs  12/15/13 0237 12/16/13 0620  NA 137 137  K 4.7 4.8  CL 103 101  CO2 21 21  GLUCOSE 116* 120*  BUN 36* 38*  CREATININE 1.24 1.33  CALCIUM 9.1 9.4   Lab Results  Component Value Date   TROPONINI 0.39* 12/09/2013     Radiology/Studies  Dg Chest 2 View  12/09/2013   CLINICAL DATA:  Chest pain radiating to be  EXAM: CHEST  2 VIEW  COMPARISON:  None.  FINDINGS: There is diffuse interstitial coarsening which is likely chronic. No overt edema. No consolidation. No cardiomegaly. Previous median sternotomy and coronary stenting. No effusion or pneumothorax.  IMPRESSION: No active cardiopulmonary disease.      ASSESSMENT AND PLAN 80 yo with history of CAD, HTN, PAD s/p right  AKA presented with ischemic right foot. Had PTCA/atherectomy/stenting left SFA on this admission. Doing well today, can feel left DP pulse and foot looks better.  Patient observed yesterday and worked with PT. [facility].  MAI/celllulitis: he is on an unusual antibiotic regimen. See pharmacy note -- Continue as planned by outpatient ID MD.   CAD: Post stenting of SVG to LAD by Dr Fletcher Anon. No chest pain LFA A with no hematoma DAT indefinately   HTN: Stable continue ACE and diuretic    Tyrell Antonio PA-C  Pager [phone]  Patient seen and examined and history reviewed. Agree with above findings and plan. Patient is doing very well. He still has leg pain but it is improved. Excellent DP pulse on the left. Right stump is clean and dry. No chest pain. Plan is to DC today. Lives in [place] at [facility].Will follow up with Dr. Gwenlyn Found in 2 weeks. Stay on dual antiplatelet therapy with ASA and  Plavix indefinitely. He recently moved from [place]. Has not established with primary care here. Reports his daughter-in-law worked in the hospital and has plans to get him a primary care here. I informed him we would be glad to help with this if need be. He is on multiple antibiotics for MAI/ cellulitis of right hand. Was told he would need to continue until May. Will go ahead and make him an appointment with ID here. Continue Lasix, Dig, ACEi, and metoprolol for severe LV dysfunction.  Collier Salina [facility]  12/16/2013 8:57 AM

## 2013-12-16 NOTE — Progress Notes (Signed)
PT Cancellation Note  Patient Details Name: Nicholas Stockhausen Sr. MRN: [id number] DOB: [birth year]   Cancelled Treatment:    Reason Eval/Treat Not Completed: PT screened, no needs identified, will sign off . Will defer PT services until next venue. [facility]   Kingsley Callander 12/16/2013, 1:56 PM

## 2013-12-16 NOTE — Progress Notes (Signed)
Report given to receiving RN. No concerns or questions expressed or noted. Patient is dressed with belongings packed. Patient is sitting in the recliner waiting for his ride.

## 2013-12-19 NOTE — Progress Notes (Signed)
Ok per MD for d/c today back to ALF at [facility]. Patient's son Duell was notified and he transported patient back via car. Patient is aware of dc and pleased to be returning to facility.  Nursing notified of d/c and called report.  No further CSW needs identified. CSW signing off.  Lorie Phenix. [place], [place]

## 2013-12-28 ENCOUNTER — Telehealth (HOSPITAL_COMMUNITY): Payer: Self-pay | Admitting: *Deleted

## 2013-12-28 NOTE — Telephone Encounter (Signed)
lmom 

## 2013-12-28 NOTE — Telephone Encounter (Signed)
Pts son is concerned about the blockage that was found in his dads heart. He is coming in to see Dr. Gwenlyn Found in March but wants to make sure that he does not need to come in any earlier. He states that Dr. Gwenlyn Found told him to rest after the procedure and then to see him back in 2 weeks. Please call

## 2013-12-28 NOTE — Telephone Encounter (Signed)
I spoke with Nicholas Henson.  He had concerns about Nicholas Henson waiting until March to follow up with Nicholas Henson about possibility of having another PCI.  Nicholas Henson is not having any discomfort in his chest, only in his leg.  He is scheduled for lower ext dopplers this week.  I tried to reassure Nicholas Henson and said that the cath report said that they might not even plan a PCI, depends on how patient is feeling.  Also, I offered an earlier appt if patient didn't feel comfortable waiting until March.  Nicholas Henson said he would talk to Nicholas Henson and let me know.

## 2013-12-30 ENCOUNTER — Ambulatory Visit (INDEPENDENT_AMBULATORY_CARE_PROVIDER_SITE_OTHER): Payer: Medicare Other | Admitting: Internal Medicine

## 2013-12-30 ENCOUNTER — Encounter: Payer: Self-pay | Admitting: Internal Medicine

## 2013-12-30 VITALS — BP 154/73 | HR 66 | Temp 98.4°F | Wt 136.0 lb

## 2013-12-30 DIAGNOSIS — A311 Cutaneous mycobacterial infection: Secondary | ICD-10-CM | POA: Insufficient documentation

## 2013-12-30 LAB — CBC WITH DIFFERENTIAL/PLATELET
Basophils Absolute: 0 10*3/uL (ref 0.0–0.1)
Basophils Relative: 0 % (ref 0–1)
EOS PCT: 1 % (ref 0–5)
Eosinophils Absolute: 0.1 10*3/uL (ref 0.0–0.7)
HEMATOCRIT: 32.5 % — AB (ref 39.0–52.0)
HEMOGLOBIN: 11.1 g/dL — AB (ref 13.0–17.0)
LYMPHS PCT: 13 % (ref 12–46)
Lymphs Abs: 0.9 10*3/uL (ref 0.7–4.0)
MCH: 29.1 pg (ref 26.0–34.0)
MCHC: 34.2 g/dL (ref 30.0–36.0)
MCV: 85.3 fL (ref 78.0–100.0)
MONO ABS: 0.8 10*3/uL (ref 0.1–1.0)
MONOS PCT: 11 % (ref 3–12)
Neutro Abs: 5.2 10*3/uL (ref 1.7–7.7)
Neutrophils Relative %: 75 % (ref 43–77)
Platelets: 121 10*3/uL — ABNORMAL LOW (ref 150–400)
RBC: 3.81 MIL/uL — ABNORMAL LOW (ref 4.22–5.81)
RDW: 15.5 % (ref 11.5–15.5)
WBC: 7 10*3/uL (ref 4.0–10.5)

## 2013-12-30 LAB — COMPLETE METABOLIC PANEL WITH GFR
ALBUMIN: 4.3 g/dL (ref 3.5–5.2)
ALT: 13 U/L (ref 0–53)
AST: 13 U/L (ref 0–37)
Alkaline Phosphatase: 70 U/L (ref 39–117)
BUN: 29 mg/dL — ABNORMAL HIGH (ref 6–23)
CHLORIDE: 105 meq/L (ref 96–112)
CO2: 22 meq/L (ref 19–32)
CREATININE: 1.12 mg/dL (ref 0.50–1.35)
Calcium: 9 mg/dL (ref 8.4–10.5)
GFR, EST AFRICAN AMERICAN: 68 mL/min
GFR, Est Non African American: 59 mL/min — ABNORMAL LOW
GLUCOSE: 140 mg/dL — AB (ref 70–99)
POTASSIUM: 4.5 meq/L (ref 3.5–5.3)
Sodium: 140 mEq/L (ref 135–145)
Total Bilirubin: 0.4 mg/dL (ref 0.2–1.2)
Total Protein: 7 g/dL (ref 6.0–8.3)

## 2013-12-30 NOTE — Progress Notes (Signed)
   Subjective:    Patient ID: Nicholas Bash Sr., male    DOB: 08-Aug-1925, 80 y.o.   MRN: [id number]  HPI Here to establish care as a new patient.  REcently moved in December to be closer to family.  Was previously in [place].  Has a history of PAD, angina and had leg amputated several months ago due to clot and after moving here had an MI with stent.  He comes her with a history of Mycobacterial marinum infection.  He describes an injury in his right hand that initially healed then opened over the pointer finger knuckle.  The wound progressed and after a difficult time with diagnosis, his culture grew out Mycobacterial marinum.  He was seen by ID at the [facility] and was started on broad therapy including Streptomycin injections.  He required operative debridement and the infection progressed up his over the wrist. Eventually it began to improve and healed.  He reports it closed up around December 2014 and has been improved since.  He currently is on Bactrim DS bid, rifampin daily and Azithromycin, 500 mg daily and was told by ID Dr. To continue with this through May as long as he did well. No fever, no chills.   Review of Systems  Constitutional: Negative for fever.  Gastrointestinal: Negative for nausea, abdominal pain, diarrhea and abdominal distention.  Genitourinary: Negative for dysuria and difficulty urinating.  Musculoskeletal: Positive for arthralgias. Negative for myalgias.  Skin: Negative for rash.  Neurological: Negative for dizziness and light-headedness.       Objective:   Physical Exam  Constitutional: He appears well-developed and well-nourished. No distress.  HENT:  Mouth/Throat: No oropharyngeal exudate.  Eyes: No scleral icterus.  Skin: No rash noted.  Hand with healed incision, no erythema, no tenderness          Assessment & Plan:

## 2013-12-30 NOTE — Assessment & Plan Note (Addendum)
Doing ok with the therapy.  I do not have sensitivity data and he is on 3 drug therapy but as long as he tolerates it well, he will continue through May.  I will check labs to day to assure no side effects.  RTC 2 months.   History reviewed, 45 minutes spent with the patient including 25 minutes getting history from patient and son.

## 2013-12-31 ENCOUNTER — Ambulatory Visit (HOSPITAL_COMMUNITY)
Admission: RE | Admit: 2013-12-31 | Discharge: 2013-12-31 | Disposition: A | Discharge status: Home / Self Care | Payer: Medicare Other | Source: Ambulatory Visit | Attending: Cardiovascular Disease | Admitting: Cardiovascular Disease

## 2013-12-31 DIAGNOSIS — I739 Peripheral vascular disease, unspecified: Secondary | ICD-10-CM | POA: Insufficient documentation

## 2013-12-31 NOTE — Progress Notes (Signed)
Left Lower Extremity Arterial Duplex Completed. °Brianna L Mazza,RVT °

## 2014-01-27 ENCOUNTER — Encounter: Payer: Self-pay | Admitting: Cardiovascular Disease

## 2014-01-27 ENCOUNTER — Ambulatory Visit (INDEPENDENT_AMBULATORY_CARE_PROVIDER_SITE_OTHER): Payer: Medicare Other | Admitting: Cardiovascular Disease

## 2014-01-27 VITALS — BP 120/60 | HR 62 | Ht 70.0 in | Wt 141.0 lb

## 2014-01-27 DIAGNOSIS — E782 Mixed hyperlipidemia: Secondary | ICD-10-CM

## 2014-01-27 DIAGNOSIS — E785 Hyperlipidemia, unspecified: Secondary | ICD-10-CM | POA: Insufficient documentation

## 2014-01-27 DIAGNOSIS — I739 Peripheral vascular disease, unspecified: Secondary | ICD-10-CM

## 2014-01-27 DIAGNOSIS — I2510 Atherosclerotic heart disease of native coronary artery without angina pectoris: Secondary | ICD-10-CM

## 2014-01-27 DIAGNOSIS — Z79899 Other long term (current) drug therapy: Secondary | ICD-10-CM

## 2014-01-27 DIAGNOSIS — I10 Essential (primary) hypertension: Secondary | ICD-10-CM

## 2014-01-27 DIAGNOSIS — I200 Unstable angina: Secondary | ICD-10-CM

## 2014-01-27 NOTE — Assessment & Plan Note (Signed)
Controlled on current medications 

## 2014-01-27 NOTE — Progress Notes (Signed)
01/27/2014 Nicholas Bash Sr.   [birth year]  [id number]  Primary Physician Woody Seller, MD Primary Cardiologist: Lorretta Harp MD Renae Gloss   HPI:  This is a 80 y.o. male widowed Caucasian male father of 3 children who we are asked to see because of unstable angina. He recently relocated from [place] to [place] to be closer to his family.he has an extensive past medical history remarkable for coronary artery disease and peripheral arterial disease. He had angioplasty of his heart performed 05/14/84. Following this he had open heart surgery with coronary artery bypass grafting x1/1/96 for what sounds like felt enteroplasty. He had a heart attack 10/17/1999 and again in 11/30/99. He had angioplasty and stent 02/14/03 and 11/02/04. He really has not had any chest pain since that time until last night when he had 3 episodes of nitroglycerin responsive chest pain. He currently is chest pain-free. His history is remarkable for peripheral arterial disease as well status post lower extremity integrate him 07/06/98 with enteroplasty of ischemia right leg 08/24/98. He had right leg bypass 08/26/98 and what sounds like aortobifemoral bypass grafting for an abdominal aortic aneurysm and arterial occlusive disease in 2009. Most recently this past year he had right above-the-knee amputation performed on multiple occasions beginning 05/28/13 with multiple revisions because of poor wound healing the last surgery being 07/21/13. He does have the limb ischemia with Cyanosis of his left first and fourth toes and resting leg pain. He saw a podiatrist last week for this and was referred to a vascular surgeon for further evaluation. He smoked 3-4 packs a day up until 1985 other problems include 2 hypertension. He's never had a stroke. EKG here in the hospital shows lateral T-wave inversion although we do not have an old EKG. The patient underwent PCI and stenting of a vein graft to  the LAD with excellent antigastric result. He does have residual obtuse marginal branch stenosis. Because of the critical limb ischemia and complex anatomy documented by angiography he underwent antegrade approach of his left lower extremity diamondback orbital rotational affect become a PTA and stenting. Discharge he denies chest pain or shortness of breath. The wounds on his left foot are slowly healing and he no longer has pain. His Dopplers revealed a left ABI 0.9 widely patent left SFA.   Current Outpatient Prescriptions  Medication Sig Dispense Refill  . alum & mag hydroxide-simeth (MYLANTA) 200-200-20 MG/5ML suspension Take 15 mLs by mouth every 8 (eight) hours as needed for indigestion or heartburn.      Marland Kitchen amLODipine (NORVASC) 5 MG tablet Take 5 mg by mouth daily.      Marland Kitchen aspirin EC 81 MG tablet Take 1 tablet (81 mg total) by mouth daily.      Marland Kitchen atorvastatin (LIPITOR) 40 MG tablet Take 1 tablet (40 mg total) by mouth daily.  30 tablet  6  . azithromycin (ZITHROMAX) 250 MG tablet Take 500 mg by mouth daily. 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  This treatment is for 1 year ending 03/26/14.      . cloNIDine (CATAPRES) 0.1 MG tablet Take 0.1 mg by mouth 3 (three) times daily as needed (if sbp >175).      . clopidogrel (PLAVIX) 75 MG tablet Take 1 tablet (75 mg total) by mouth daily with breakfast.  30 tablet  11  . digoxin (LANOXIN) 0.25 MG tablet Take 0.25 mg by mouth daily.      Marland Kitchen docusate sodium (  COLACE) 100 MG capsule Take 100 mg by mouth 2 (two) times daily.      Marland Kitchen dutasteride (AVODART) 0.5 MG capsule Take 0.5 mg by mouth daily.      . ferrous sulfate 325 (65 FE) MG tablet Take 325 mg by mouth every evening.      . furosemide (LASIX) 40 MG tablet Take 1 tablet (40 mg total) by mouth daily.  30 tablet  6  . gabapentin (NEURONTIN) 300 MG capsule Take 300 mg by mouth 3 (three) times daily. Also takes 400mg  at night      . HYDROcodone-acetaminophen (NORCO) 10-325 MG per tablet  Take 1 tablet by mouth every 6 (six) hours as needed for moderate pain or severe pain.      Marland Kitchen lisinopril (PRINIVIL,ZESTRIL) 20 MG tablet Take 20 mg by mouth daily.      . metoprolol (LOPRESSOR) 100 MG tablet Take 100 mg by mouth 2 (two) times daily.      . Multiple Vitamin (MULTIVITAMIN WITH MINERALS) TABS tablet Take 1 tablet by mouth daily.      . nitroGLYCERIN (NITROSTAT) 0.4 MG SL tablet Place 0.4 mg under the tongue every 5 (five) minutes as needed for chest pain.      . ranitidine (ZANTAC) 150 MG tablet Take 150 mg by mouth 2 (two) times daily.      . rifampin (RIFADIN) 300 MG capsule Take 1 capsule (300 mg total) by mouth 2 (two) times daily.  60 capsule  4  . sulfamethoxazole-trimethoprim (BACTRIM DS) 800-160 MG per tablet Take 1 tablet by mouth 2 (two) times daily. 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  Treatment ending 03/26/14      . vitamin C (ASCORBIC ACID) 500 MG tablet Take 500 mg by mouth 2 (two) times daily.      Marland Kitchen zinc sulfate 220 MG capsule Take 220 mg by mouth daily.       No current facility-administered medications for this visit.    Allergies  Allergen Reactions  . Chlorpazine [Prochlorperazine] Other (See Comments)    unknown  . Metoclopramide Other (See Comments)    hyperactivity  . Oxycodone Other (See Comments)    Causes confusion  . Tape Hives and Other (See Comments)    blisters    History   Social History  . Marital Status: Widowed    Spouse Name: N/A    Number of Children: N/A  . Years of Education: N/A   Occupational History  . Not on file.   Social History Main Topics  . Smoking status: Former Smoker    Quit date: 05/14/1984  . Smokeless tobacco: Not on file  . Alcohol Use: No  . Drug Use: No  . Sexual Activity: Not on file   Other Topics Concern  . Not on file   Social History Narrative  . No narrative on file     Review of Systems: General: negative for chills, fever, night sweats or weight changes.    Cardiovascular: negative for chest pain, dyspnea on exertion, edema, orthopnea, palpitations, paroxysmal nocturnal dyspnea or shortness of breath Dermatological: negative for rash Respiratory: negative for cough or wheezing Urologic: negative for hematuria Abdominal: negative for nausea, vomiting, diarrhea, bright red blood per rectum, melena, or hematemesis Neurologic: negative for visual changes, syncope, or dizziness All other systems reviewed and are otherwise negative except as noted above.    Blood pressure 120/60, pulse 62, height 5\' 10"  (1.778 m), weight 63.957 kg (141 lb).  General appearance: alert and no distress Neck: no adenopathy, no JVD, supple, symmetrical, trachea midline, thyroid not enlarged, symmetric, no tenderness/mass/nodules and soft bilateral carotid bruits Lungs: clear to auscultation bilaterally Heart: regular rate and rhythm, S1, S2 normal, no murmur, click, rub or gallop Extremities: right AKA,  healing wounds left toes  with palpable pedal pulses  EKG not performed today  ASSESSMENT AND PLAN:   Coronary artery disease Status post coronary artery bypass grafting in 1996. Your heart attack 10/17/1999 and again in 11/30/99. He had intravascular stenting 02/14/03 and 11/02/04. He was admitted with chest pain and critical limb ischemia. He underwent cardiac catheterization and peripheral angiography. Dr. Fletcher Anon  placed a drug-eluting stent in his L. LAD SVG using distal protection.he did have moderate inferolateral hypokinesia there was EF by echo was 50-55%. He had an occluded RCA was occluded RCA vein graft with left to right collaterals. He had a 95% proximal OM1 branch stenosis but this was a small vessel. He has remained asymptomatic. I'm inclined to leave this alone and treat him medically.  Hypertension Controlled on current medications  Hyperlipidemia On statin therapy. We'll recheck a lipid and liver profile  Peripheral arterial disease The patient has  had a remote aortobifemoral bypass graft. He had a right AKA. He had critical ischemia of the left with gangrenous appearing toes which were actually painful. His angiogram revealed a high-grade segmental calcified proximal mid left SFA stenosis with three-vessel runoff. I performed diamondback or rotational atherectomy, PTA and stenting echocardiographic and clinical result. The followup arterial Dopplers performed an office 12/31/13 revealed a left ABI 0.94 with a widely patent SFA. His toes are no longer painful and the wounds are slowly healing.      Lorretta Harp MD FACP,FACC,FAHA, [facility] 01/27/2014 4:30 PM

## 2014-01-27 NOTE — Assessment & Plan Note (Signed)
Status post coronary artery bypass grafting in 1996. Your heart attack 10/17/1999 and again in 11/30/99. He had intravascular stenting 02/14/03 and 11/02/04. He was admitted with chest pain and critical limb ischemia. He underwent cardiac catheterization and peripheral angiography. Dr. Fletcher Anon  placed a drug-eluting stent in his L. LAD SVG using distal protection.he did have moderate inferolateral hypokinesia there was EF by echo was 50-55%. He had an occluded RCA was occluded RCA vein graft with left to right collaterals. He had a 95% proximal OM1 branch stenosis but this was a small vessel. He has remained asymptomatic. I'm inclined to leave this alone and treat him medically.

## 2014-01-27 NOTE — Patient Instructions (Signed)
Your physician wants you to follow-up in: 3 months with Dr Gwenlyn Found. You will receive a reminder letter in the mail two months in advance. If you don't receive a letter, please call our office to schedule the follow-up appointment.  Dr Gwenlyn Found has ordered a carotid doppler. Your physician has requested that you have a carotid duplex. This test is an ultrasound of the carotid arteries in your neck. It looks at blood flow through these arteries that supply the brain with blood. Allow one hour for this exam. There are no restrictions or special instructions.  Dr Gwenlyn Found has ordered lower extremity arterial doppler to be done in 6 months.    Please have blood work done, fasting, to check your cholesterol levels.

## 2014-01-27 NOTE — Assessment & Plan Note (Signed)
The patient has had a remote aortobifemoral bypass graft. He had a right AKA. He had critical ischemia of the left with gangrenous appearing toes which were actually painful. His angiogram revealed a high-grade segmental calcified proximal mid left SFA stenosis with three-vessel runoff. I performed diamondback or rotational atherectomy, PTA and stenting echocardiographic and clinical result. The followup arterial Dopplers performed an office 12/31/13 revealed a left ABI 0.94 with a widely patent SFA. His toes are no longer painful and the wounds are slowly healing.

## 2014-01-27 NOTE — Assessment & Plan Note (Signed)
On statin therapy. We'll recheck a lipid and liver profile 

## 2014-02-02 ENCOUNTER — Ambulatory Visit (HOSPITAL_COMMUNITY)
Admission: RE | Admit: 2014-02-02 | Discharge: 2014-02-02 | Disposition: A | Discharge status: Home / Self Care | Payer: Medicare Other | Source: Ambulatory Visit | Attending: Cardiovascular Disease | Admitting: Cardiovascular Disease

## 2014-02-02 DIAGNOSIS — I70209 Unspecified atherosclerosis of native arteries of extremities, unspecified extremity: Secondary | ICD-10-CM | POA: Insufficient documentation

## 2014-02-02 DIAGNOSIS — I739 Peripheral vascular disease, unspecified: Secondary | ICD-10-CM

## 2014-02-02 NOTE — Progress Notes (Signed)
Carotid Duplex Completed. °Brianna L Mazza,RVT °

## 2014-02-10 ENCOUNTER — Telehealth: Payer: Self-pay | Admitting: *Deleted

## 2014-02-10 DIAGNOSIS — I6529 Occlusion and stenosis of unspecified carotid artery: Secondary | ICD-10-CM

## 2014-02-10 NOTE — Telephone Encounter (Signed)
Message copied by Chauncy Lean on Wed Feb 10, 2014 12:45 PM ------      Message from: Lorretta Harp      Created: Sun Feb 07, 2014  6:48 PM       Mildly abnormal carotid duplex. Repeat 12 months ------

## 2014-02-10 NOTE — Telephone Encounter (Signed)
Order placed for repeat carotid dopplers in 1 year  

## 2014-02-24 ENCOUNTER — Ambulatory Visit: Payer: PRIVATE HEALTH INSURANCE | Admitting: Internal Medicine

## 2014-04-30 ENCOUNTER — Ambulatory Visit: Payer: Medicare Other | Admitting: Cardiovascular Disease

## 2014-04-30 ENCOUNTER — Telehealth: Payer: Self-pay | Admitting: Cardiovascular Disease

## 2014-04-30 NOTE — Telephone Encounter (Signed)
Closed encounter °

## 2014-05-25 ENCOUNTER — Encounter: Payer: Self-pay | Admitting: Cardiovascular Disease

## 2014-05-25 ENCOUNTER — Ambulatory Visit (INDEPENDENT_AMBULATORY_CARE_PROVIDER_SITE_OTHER): Payer: Medicare Other | Admitting: Cardiovascular Disease

## 2014-05-25 VITALS — BP 126/70 | HR 72 | Ht 70.0 in | Wt 143.4 lb

## 2014-05-25 DIAGNOSIS — I2510 Atherosclerotic heart disease of native coronary artery without angina pectoris: Secondary | ICD-10-CM

## 2014-05-25 DIAGNOSIS — I779 Disorder of arteries and arterioles, unspecified: Secondary | ICD-10-CM

## 2014-05-25 DIAGNOSIS — I2583 Coronary atherosclerosis due to lipid rich plaque: Secondary | ICD-10-CM

## 2014-05-25 DIAGNOSIS — I200 Unstable angina: Secondary | ICD-10-CM

## 2014-05-25 DIAGNOSIS — E785 Hyperlipidemia, unspecified: Secondary | ICD-10-CM

## 2014-05-25 DIAGNOSIS — I10 Essential (primary) hypertension: Secondary | ICD-10-CM

## 2014-05-25 DIAGNOSIS — I739 Peripheral vascular disease, unspecified: Secondary | ICD-10-CM

## 2014-05-25 NOTE — Assessment & Plan Note (Signed)
On statin therapy with recent lipid profile performed 12/10/13 revealed a total cholesterol of 128, LDL 74 and HDL of 30

## 2014-05-25 NOTE — Assessment & Plan Note (Signed)
Moderate right internal carotid artery stenosis by duplex ultrasound. We'll continue to follow this up noninvasively. He is neurologically asymptomatic on aspirin and clopidogrel.

## 2014-05-25 NOTE — Assessment & Plan Note (Signed)
The patient had critical limb ischemia. He had remote aortobifemoral bypass grafting. I angiogramed him 12/09/13 revealing a long 90% calcified left SFA stenosis with 2 vessel runoff. 5 days later I returned and performed intervention to his aortobifem using antegrade access, diamondback orbital rotational atherectomy and stenting. Followup Dopplers performed 12/31/13 revealed a left ABI 0.94 with widely patent stent. His ulcers healed and his pain resolved. He now is able to ambulate with a prosthesis.

## 2014-05-25 NOTE — Patient Instructions (Signed)
Your physician recommends that you schedule a follow-up appointment in: [place] with Dr.Berry  .Your physician has requested that you have a lower arterial duplex. This test is an ultrasound of the arteries in the legs. It looks at arterial blood flow in the legs. Allow one hour for Lower Arterial scans. There are no restrictions or special instructions...these will be in Feburary 2016

## 2014-05-25 NOTE — Progress Notes (Signed)
05/25/2014 Nicholas Bash Sr.   March 23, 1925  [id number]  Primary Physician Woody Seller, MD Primary Cardiologist: Lorretta Harp MD Nicholas Henson   HPI:  This is a 80 y.o. male widowed Caucasian male father of 3 children who we are asked to see because of unstable angina. He recently relocated from [place] to [place] to be closer to his family.he has an extensive past medical history remarkable for coronary artery disease and peripheral arterial disease. He had angioplasty of his heart performed 05/14/84. Following this he had open heart surgery with coronary artery bypass grafting x1/1/96 for what sounds like felt enteroplasty. He had a heart attack 10/17/1999 and again in 11/30/99. He had angioplasty and stent 02/14/03 and 11/02/04. He really has not had any chest pain since that time until last night when he had 3 episodes of nitroglycerin responsive chest pain. He currently is chest pain-free. His history is remarkable for peripheral arterial disease as well status post lower extremity integrate him 07/06/98 with enteroplasty of ischemia right leg 08/24/98. He had right leg bypass 08/26/98 and what sounds like aortobifemoral bypass grafting for an abdominal aortic aneurysm and arterial occlusive disease in 2009. Most recently this past year he had right above-the-knee amputation performed on multiple occasions beginning 05/28/13 with multiple revisions because of poor wound healing the last surgery being 07/21/13. He does have the limb ischemia with Cyanosis of his left first and fourth toes and resting leg pain. He saw a podiatrist last week for this and was referred to a vascular surgeon for further evaluation. He smoked 3-4 packs a day up until 1985 other problems include 2 hypertension. He's never had a stroke. EKG here in the hospital shows lateral T-wave inversion although we do not have an old EKG. The patient underwent PCI and stenting of a vein graft to  the LAD with excellent antigastric result. He does have residual obtuse marginal branch stenosis. Because of the critical limb ischemia and complex anatomy documented by angiography he underwent antegrade approach of his left lower extremity diamondback orbital rotational affect become a PTA and stenting. followup low initially until Doppler studies revealed essentially widely patent with a near normal ABI. He is able to walk with a prosthesis on his right leg. Carotid Doppler showed moderate right internal carotid artery stenosis. He denies chest pain or shortness of breath.   Current Outpatient Prescriptions  Medication Sig Dispense Refill  . alum & mag hydroxide-simeth (MYLANTA) 200-200-20 MG/5ML suspension Take 15 mLs by mouth every 8 (eight) hours as needed for indigestion or heartburn.      Marland Kitchen amLODipine (NORVASC) 5 MG tablet Take 5 mg by mouth daily.      Marland Kitchen aspirin EC 81 MG tablet Take 1 tablet (81 mg total) by mouth daily.      Marland Kitchen atorvastatin (LIPITOR) 40 MG tablet Take 1 tablet (40 mg total) by mouth daily.  30 tablet  6  . cloNIDine (CATAPRES) 0.1 MG tablet Take 0.1 mg by mouth 3 (three) times daily as needed (if sbp >175).      . clopidogrel (PLAVIX) 75 MG tablet Take 1 tablet (75 mg total) by mouth daily with breakfast.  30 tablet  11  . digoxin (LANOXIN) 0.25 MG tablet Take 0.25 mg by mouth daily.      Marland Kitchen docusate sodium (COLACE) 100 MG capsule Take 100 mg by mouth 2 (two) times daily.      Marland Kitchen dutasteride (AVODART) 0.5 MG capsule Take  0.5 mg by mouth daily.      . ferrous sulfate 325 (65 FE) MG tablet Take 325 mg by mouth every evening.      . furosemide (LASIX) 40 MG tablet Take 1 tablet (40 mg total) by mouth daily.  30 tablet  6  . gabapentin (NEURONTIN) 300 MG capsule Take 1 capsule (300 mg total) by mouth three times daily, take 2 capsules (600 mg total) by mouth at bedtime.      Marland Kitchen HYDROcodone-acetaminophen (NORCO) 10-325 MG per tablet Take 1 tablet by mouth every 6 (six) hours as  needed for moderate pain or severe pain.      Marland Kitchen lisinopril (PRINIVIL,ZESTRIL) 20 MG tablet Take 20 mg by mouth daily.      . metoprolol (LOPRESSOR) 100 MG tablet Take 100 mg by mouth 2 (two) times daily.      . nitroGLYCERIN (NITROSTAT) 0.4 MG SL tablet Place 0.4 mg under the tongue every 5 (five) minutes as needed for chest pain.      . ranitidine (ZANTAC) 150 MG tablet Take 150 mg by mouth 2 (two) times daily.      . rifampin (RIFADIN) 300 MG capsule Take 1 capsule (300 mg total) by mouth 2 (two) times daily.  60 capsule  4  . vitamin C (ASCORBIC ACID) 500 MG tablet Take 500 mg by mouth 2 (two) times daily.      Marland Kitchen zinc sulfate 220 MG capsule Take 220 mg by mouth daily.       No current facility-administered medications for this visit.    Allergies  Allergen Reactions  . Chlorpazine [Prochlorperazine] Other (See Comments)    unknown  . Metoclopramide Other (See Comments)    hyperactivity  . Oxycodone Other (See Comments)    Causes confusion  . Tape Hives and Other (See Comments)    blisters    History   Social History  . Marital Status: Widowed    Spouse Name: N/A    Number of Children: N/A  . Years of Education: N/A   Occupational History  . Not on file.   Social History Main Topics  . Smoking status: Former Smoker    Quit date: 05/14/1984  . Smokeless tobacco: Not on file  . Alcohol Use: No  . Drug Use: No  . Sexual Activity: Not on file   Other Topics Concern  . Not on file   Social History Narrative  . No narrative on file     Review of Systems: General: negative for chills, fever, night sweats or weight changes.  Cardiovascular: negative for chest pain, dyspnea on exertion, edema, orthopnea, palpitations, paroxysmal nocturnal dyspnea or shortness of breath Dermatological: negative for rash Respiratory: negative for cough or wheezing Urologic: negative for hematuria Abdominal: negative for nausea, vomiting, diarrhea, bright red blood per rectum, melena, or  hematemesis Neurologic: negative for visual changes, syncope, or dizziness All other systems reviewed and are otherwise negative except as noted above.    Blood pressure 126/70, pulse 72, height 5\' 10"  (1.778 m), weight 143 lb 6.4 oz (65.046 kg).  General appearance: alert and no distress Neck: no adenopathy, no JVD, supple, symmetrical, trachea midline, thyroid not enlarged, symmetric, no tenderness/mass/nodules and soft bilateral carotid bruits Lungs: clear to auscultation bilaterally Heart: regular rate and rhythm, S1, S2 normal, no murmur, click, rub or gallop Extremities: right AKA  EKG not performed today  ASSESSMENT AND PLAN:   Peripheral arterial disease The patient had critical limb ischemia. He had remote  aortobifemoral bypass grafting. I angiogramed him 12/09/13 revealing a long 90% calcified left SFA stenosis with 2 vessel runoff. 5 days later I returned and performed intervention to his aortobifem using antegrade access, diamondback orbital rotational atherectomy and stenting. Followup Dopplers performed 12/31/13 revealed a left ABI 0.94 with widely patent stent. His ulcers healed and his pain resolved. He now is able to ambulate with a prosthesis.  Carotid artery disease Moderate right internal carotid artery stenosis by duplex ultrasound. We'll continue to follow this up noninvasively. He is neurologically asymptomatic on aspirin and clopidogrel.  Hyperlipidemia On statin therapy with recent lipid profile performed 12/10/13 revealed a total cholesterol of 128, LDL 74 and HDL of 30  Hypertension Controlled on current medications  Coronary artery disease History of CAD status post coronary intervention multiple times in the past. He denies chest pain or shortness of breath.      Lorretta Harp MD FACP,FACC,FAHA, [facility] 05/25/2014 4:30 PM

## 2014-05-25 NOTE — Assessment & Plan Note (Signed)
Controlled on current medications 

## 2014-05-25 NOTE — Assessment & Plan Note (Signed)
History of CAD status post coronary intervention multiple times in the past. He denies chest pain or shortness of breath.

## 2014-11-04 ENCOUNTER — Encounter (HOSPITAL_COMMUNITY): Payer: Self-pay | Admitting: Cardiovascular Disease

## 2015-02-03 ENCOUNTER — Ambulatory Visit (HOSPITAL_COMMUNITY)
Admission: RE | Admit: 2015-02-03 | Discharge: 2015-02-03 | Disposition: A | Discharge status: Home / Self Care | Payer: Medicare Other | Source: Ambulatory Visit | Attending: Cardiology | Admitting: Cardiology

## 2015-02-03 DIAGNOSIS — I779 Disorder of arteries and arterioles, unspecified: Secondary | ICD-10-CM | POA: Diagnosis not present

## 2015-02-03 DIAGNOSIS — I6521 Occlusion and stenosis of right carotid artery: Secondary | ICD-10-CM

## 2015-02-03 DIAGNOSIS — I6529 Occlusion and stenosis of unspecified carotid artery: Secondary | ICD-10-CM

## 2015-02-03 HISTORY — PX: OTHER SURGICAL HISTORY: SHX169

## 2015-02-03 NOTE — Progress Notes (Signed)
Carotid Duplex Completed. Chistopher Mangino, BS, RDMS, RVT  

## 2015-02-08 ENCOUNTER — Encounter: Payer: Self-pay | Admitting: *Deleted

## 2015-03-08 ENCOUNTER — Inpatient Hospital Stay (HOSPITAL_COMMUNITY)
Admission: EM | Admit: 2015-03-08 | Discharge: 2015-03-10 | DRG: 251 | Disposition: A | Discharge status: Skilled Nursing Facility | Payer: Medicare Other | Attending: Cardiovascular Disease | Admitting: Cardiovascular Disease

## 2015-03-08 ENCOUNTER — Emergency Department (HOSPITAL_COMMUNITY): Payer: Medicare Other

## 2015-03-08 ENCOUNTER — Encounter (HOSPITAL_COMMUNITY): Payer: Self-pay

## 2015-03-08 DIAGNOSIS — I739 Peripheral vascular disease, unspecified: Secondary | ICD-10-CM | POA: Diagnosis present

## 2015-03-08 DIAGNOSIS — I25110 Atherosclerotic heart disease of native coronary artery with unstable angina pectoris: Principal | ICD-10-CM | POA: Diagnosis present

## 2015-03-08 DIAGNOSIS — Z87891 Personal history of nicotine dependence: Secondary | ICD-10-CM

## 2015-03-08 DIAGNOSIS — R079 Chest pain, unspecified: Secondary | ICD-10-CM

## 2015-03-08 DIAGNOSIS — Z66 Do not resuscitate: Secondary | ICD-10-CM | POA: Diagnosis present

## 2015-03-08 DIAGNOSIS — I5042 Chronic combined systolic (congestive) and diastolic (congestive) heart failure: Secondary | ICD-10-CM | POA: Diagnosis present

## 2015-03-08 DIAGNOSIS — Z89611 Acquired absence of right leg above knee: Secondary | ICD-10-CM

## 2015-03-08 DIAGNOSIS — Z7982 Long term (current) use of aspirin: Secondary | ICD-10-CM

## 2015-03-08 DIAGNOSIS — T82898A Other specified complication of vascular prosthetic devices, implants and grafts, initial encounter: Secondary | ICD-10-CM | POA: Diagnosis present

## 2015-03-08 DIAGNOSIS — E785 Hyperlipidemia, unspecified: Secondary | ICD-10-CM | POA: Diagnosis present

## 2015-03-08 DIAGNOSIS — T82858A Stenosis of vascular prosthetic devices, implants and grafts, initial encounter: Secondary | ICD-10-CM | POA: Diagnosis present

## 2015-03-08 DIAGNOSIS — I2510 Atherosclerotic heart disease of native coronary artery without angina pectoris: Secondary | ICD-10-CM | POA: Diagnosis not present

## 2015-03-08 DIAGNOSIS — I200 Unstable angina: Secondary | ICD-10-CM | POA: Diagnosis present

## 2015-03-08 DIAGNOSIS — I10 Essential (primary) hypertension: Secondary | ICD-10-CM | POA: Diagnosis present

## 2015-03-08 DIAGNOSIS — I2582 Chronic total occlusion of coronary artery: Secondary | ICD-10-CM | POA: Diagnosis present

## 2015-03-08 DIAGNOSIS — E119 Type 2 diabetes mellitus without complications: Secondary | ICD-10-CM | POA: Insufficient documentation

## 2015-03-08 DIAGNOSIS — I779 Disorder of arteries and arterioles, unspecified: Secondary | ICD-10-CM | POA: Diagnosis present

## 2015-03-08 DIAGNOSIS — Z7902 Long term (current) use of antithrombotics/antiplatelets: Secondary | ICD-10-CM

## 2015-03-08 DIAGNOSIS — Y832 Surgical operation with anastomosis, bypass or graft as the cause of abnormal reaction of the patient, or of later complication, without mention of misadventure at the time of the procedure: Secondary | ICD-10-CM | POA: Diagnosis present

## 2015-03-08 HISTORY — DX: Squamous cell carcinoma of skin of nose: C44.321

## 2015-03-08 HISTORY — DX: Type 2 diabetes mellitus without complications: E11.9

## 2015-03-08 HISTORY — DX: Acute myocardial infarction, unspecified: I21.9

## 2015-03-08 HISTORY — DX: Calculus of kidney: N20.0

## 2015-03-08 HISTORY — DX: Gastro-esophageal reflux disease without esophagitis: K21.9

## 2015-03-08 LAB — BASIC METABOLIC PANEL
Anion gap: 11 (ref 5–15)
BUN: 22 mg/dL (ref 6–23)
CO2: 24 mmol/L (ref 19–32)
Calcium: 9.4 mg/dL (ref 8.4–10.5)
Chloride: 100 mmol/L (ref 96–112)
Creatinine, Ser: 1.19 mg/dL (ref 0.50–1.35)
GFR calc Af Amer: 61 mL/min — ABNORMAL LOW (ref >90)
GFR calc non Af Amer: 53 mL/min — ABNORMAL LOW (ref >90)
Glucose, Bld: 248 mg/dL — ABNORMAL HIGH (ref 70–99)
Potassium: 4.1 mmol/L (ref 3.5–5.1)
Sodium: 135 mmol/L (ref 135–145)

## 2015-03-08 LAB — I-STAT TROPONIN, ED: Troponin i, poc: 0.04 ng/mL (ref 0.00–0.08)

## 2015-03-08 LAB — TSH: TSH: 2.065 u[IU]/mL (ref 0.350–4.500)

## 2015-03-08 LAB — GLUCOSE, CAPILLARY
GLUCOSE-CAPILLARY: 102 mg/dL — AB (ref 70–99)
GLUCOSE-CAPILLARY: 284 mg/dL — AB (ref 70–99)

## 2015-03-08 LAB — TROPONIN I
Troponin I: 0.05 ng/mL — ABNORMAL HIGH (ref <0.031)
Troponin I: 0.04 ng/mL — ABNORMAL HIGH (ref <0.031)

## 2015-03-08 LAB — CBC
HCT: 39.2 % (ref 39.0–52.0)
Hemoglobin: 13.1 g/dL (ref 13.0–17.0)
MCH: 28.5 pg (ref 26.0–34.0)
MCHC: 33.4 g/dL (ref 30.0–36.0)
MCV: 85.4 fL (ref 78.0–100.0)
Platelets: 100 10*3/uL — ABNORMAL LOW (ref 150–400)
RBC: 4.59 MIL/uL (ref 4.22–5.81)
RDW: 15.1 % (ref 11.5–15.5)
WBC: 8.2 10*3/uL (ref 4.0–10.5)

## 2015-03-08 LAB — DIGOXIN LEVEL: Digoxin Level: 0.9 ng/mL (ref 0.8–2.0)

## 2015-03-08 MED ORDER — FERROUS SULFATE 325 (65 FE) MG PO TABS
325.0000 mg | ORAL_TABLET | Freq: Every evening | ORAL | Status: DC
  Administered 2015-03-08: 325 mg via ORAL
  Filled 2015-03-08 (×2): qty 1

## 2015-03-08 MED ORDER — MORPHINE SULFATE 4 MG/ML IJ SOLN: 4.0000 mg | Freq: Once | INTRAMUSCULAR | Status: DC

## 2015-03-08 MED ORDER — GABAPENTIN 300 MG PO CAPS
300.0000 mg | ORAL_CAPSULE | Freq: Every day | ORAL | Status: DC
  Administered 2015-03-08 – 2015-03-09 (×2): 300 mg via ORAL
  Filled 2015-03-08 (×3): qty 1

## 2015-03-08 MED ORDER — ASPIRIN EC 81 MG PO TBEC
81.0000 mg | DELAYED_RELEASE_TABLET | Freq: Every day | ORAL | Status: DC
  Administered 2015-03-09: 81 mg via ORAL
  Filled 2015-03-08: qty 1

## 2015-03-08 MED ORDER — FAMOTIDINE 20 MG PO TABS
20.0000 mg | ORAL_TABLET | Freq: Every day | ORAL | Status: DC
  Administered 2015-03-08 – 2015-03-09 (×2): 20 mg via ORAL
  Filled 2015-03-08 (×2): qty 1

## 2015-03-08 MED ORDER — ASPIRIN EC 81 MG PO TBEC: 81.0000 mg | DELAYED_RELEASE_TABLET | Freq: Every day | ORAL | Status: DC

## 2015-03-08 MED ORDER — NITROGLYCERIN 0.4 MG SL SUBL: 0.4000 mg | SUBLINGUAL_TABLET | SUBLINGUAL | Status: DC | PRN

## 2015-03-08 MED ORDER — ATORVASTATIN CALCIUM 40 MG PO TABS
40.0000 mg | ORAL_TABLET | Freq: Every day | ORAL | Status: DC
  Administered 2015-03-08 – 2015-03-10 (×3): 40 mg via ORAL
  Filled 2015-03-08 (×3): qty 1

## 2015-03-08 MED ORDER — ASPIRIN 300 MG RE SUPP: 300.0000 mg | RECTAL | Status: DC

## 2015-03-08 MED ORDER — LISINOPRIL 20 MG PO TABS
20.0000 mg | ORAL_TABLET | Freq: Every day | ORAL | Status: DC
  Administered 2015-03-08 – 2015-03-10 (×3): 20 mg via ORAL
  Filled 2015-03-08 (×4): qty 1

## 2015-03-08 MED ORDER — DULOXETINE HCL 30 MG PO CPEP
30.0000 mg | ORAL_CAPSULE | Freq: Every day | ORAL | Status: DC
  Administered 2015-03-09 – 2015-03-10 (×2): 30 mg via ORAL
  Filled 2015-03-08 (×2): qty 1

## 2015-03-08 MED ORDER — DIGOXIN 125 MCG PO TABS
0.1250 mg | ORAL_TABLET | Freq: Every day | ORAL | Status: DC
  Administered 2015-03-09 – 2015-03-10 (×2): 0.125 mg via ORAL
  Filled 2015-03-08 (×2): qty 1

## 2015-03-08 MED ORDER — RIFAMPIN 300 MG PO CAPS
300.0000 mg | ORAL_CAPSULE | Freq: Two times a day (BID) | ORAL | Status: DC
  Administered 2015-03-08 – 2015-03-09 (×2): 300 mg via ORAL
  Filled 2015-03-08 (×5): qty 1

## 2015-03-08 MED ORDER — GABAPENTIN 100 MG PO CAPS
100.0000 mg | ORAL_CAPSULE | Freq: Two times a day (BID) | ORAL | Status: DC
  Administered 2015-03-09 – 2015-03-10 (×3): 100 mg via ORAL
  Filled 2015-03-08 (×4): qty 1

## 2015-03-08 MED ORDER — METOPROLOL TARTRATE 25 MG PO TABS
100.0000 mg | ORAL_TABLET | Freq: Two times a day (BID) | ORAL | Status: DC
  Administered 2015-03-08 – 2015-03-10 (×4): 100 mg via ORAL
  Filled 2015-03-08 (×2): qty 4
  Filled 2015-03-08 (×2): qty 1

## 2015-03-08 MED ORDER — SODIUM CHLORIDE 0.9 % IV SOLN
INTRAVENOUS | Status: DC
  Administered 2015-03-09: 04:00:00 via INTRAVENOUS

## 2015-03-08 MED ORDER — ONDANSETRON HCL 4 MG/2ML IJ SOLN
4.0000 mg | Freq: Four times a day (QID) | INTRAMUSCULAR | Status: DC | PRN
  Administered 2015-03-09: 4 mg via INTRAVENOUS
  Filled 2015-03-08: qty 2

## 2015-03-08 MED ORDER — AMLODIPINE BESYLATE 5 MG PO TABS
5.0000 mg | ORAL_TABLET | Freq: Every day | ORAL | Status: DC
  Administered 2015-03-09 – 2015-03-10 (×2): 5 mg via ORAL
  Filled 2015-03-08 (×3): qty 1

## 2015-03-08 MED ORDER — GABAPENTIN 100 MG PO CAPS: 100.0000 mg | ORAL_CAPSULE | ORAL | Status: DC

## 2015-03-08 MED ORDER — CLOPIDOGREL BISULFATE 75 MG PO TABS
75.0000 mg | ORAL_TABLET | Freq: Every day | ORAL | Status: DC
  Administered 2015-03-09 – 2015-03-10 (×2): 75 mg via ORAL
  Filled 2015-03-08 (×2): qty 1

## 2015-03-08 MED ORDER — FINASTERIDE 5 MG PO TABS
5.0000 mg | ORAL_TABLET | Freq: Every day | ORAL | Status: DC
  Administered 2015-03-09 – 2015-03-10 (×2): 5 mg via ORAL
  Filled 2015-03-08 (×2): qty 1

## 2015-03-08 MED ORDER — ACETAMINOPHEN 325 MG PO TABS
650.0000 mg | ORAL_TABLET | ORAL | Status: DC | PRN
  Administered 2015-03-08 – 2015-03-09 (×4): 650 mg via ORAL
  Filled 2015-03-08 (×5): qty 2

## 2015-03-08 MED ORDER — HEPARIN BOLUS VIA INFUSION
4000.0000 [IU] | Freq: Once | INTRAVENOUS | Status: AC
  Administered 2015-03-08: 4000 [IU] via INTRAVENOUS
  Filled 2015-03-08: qty 4000

## 2015-03-08 MED ORDER — CLONIDINE HCL 0.1 MG PO TABS
0.1000 mg | ORAL_TABLET | Freq: Three times a day (TID) | ORAL | Status: DC | PRN
  Administered 2015-03-10: 0.1 mg via ORAL
  Filled 2015-03-08 (×3): qty 1

## 2015-03-08 MED ORDER — HEPARIN (PORCINE) IN NACL 100-0.45 UNIT/ML-% IJ SOLN
1100.0000 [IU]/h | INTRAMUSCULAR | Status: DC
  Administered 2015-03-08: 800 [IU]/h via INTRAVENOUS
  Filled 2015-03-08: qty 250

## 2015-03-08 MED ORDER — ACETAMINOPHEN 325 MG PO TABS
650.0000 mg | ORAL_TABLET | Freq: Once | ORAL | Status: AC
  Administered 2015-03-08: 650 mg via ORAL
  Filled 2015-03-08: qty 2

## 2015-03-08 MED ORDER — ASPIRIN 81 MG PO CHEW: 324.0000 mg | CHEWABLE_TABLET | ORAL | Status: DC

## 2015-03-08 MED ORDER — DOCUSATE SODIUM 100 MG PO CAPS
100.0000 mg | ORAL_CAPSULE | Freq: Two times a day (BID) | ORAL | Status: DC
  Administered 2015-03-08 – 2015-03-10 (×4): 100 mg via ORAL
  Filled 2015-03-08 (×5): qty 1

## 2015-03-08 MED ORDER — HYDROCODONE-ACETAMINOPHEN 10-325 MG PO TABS
1.0000 | ORAL_TABLET | Freq: Three times a day (TID) | ORAL | Status: DC
  Administered 2015-03-08 – 2015-03-09 (×4): 1 via ORAL
  Filled 2015-03-08 (×4): qty 1

## 2015-03-08 NOTE — Progress Notes (Signed)
Orthopedic Tech Progress Note Patient Details:  Haley Roza Sr. 1925/06/11 [id number]  Ortho Devices Ortho Device/Splint Location: applied overehead frame to bed Ortho Device/Splint Interventions: Ordered, Application   Braulio Bosch 03/08/2015, 9:20 PM

## 2015-03-08 NOTE — ED Provider Notes (Signed)
CSN: [id number]     Arrival date & time 03/08/15  1115 History   First MD Initiated Contact with Patient 03/08/15 1116     Chief Complaint  Patient presents with  . Chest Pain     (Consider location/radiation/quality/duration/timing/severity/associated sxs/prior Treatment) HPI   81 year old male with chest pain. Patient describes pressure in his left chest which sometimes radiates to his left shoulder. Intermittent over the past week or so. Brought on by exertion such as walking. Patient is noticed symptoms progressing with more intense pressure and being brought on by less exertion. Patient was having symptoms when EMS arrived. He is given 4 aspirin and nitroglycerin. He states that his pain resolved shortly after that. He currently has no chest pain. History of CAD status post coronary intervention multiple times in the past. He has complaints of pain in his left foot, but attributes this to neuropathy denies any acute change. . No associated symptoms when he was having his painful episodes such as nausea, dyspnea, palpitations or diaphoresis. Cardiologist is Dr. Gwenlyn Found.  Past Medical History  Diagnosis Date  . Coronary artery disease     a. s/p angioplasty in 1985, coronary artery bypass grafting x1  in 1996, heart attack in 2000, PCTA/stent 2004 and 2005, s/p 12/16/13 DES to SVG to LAD   . Peripheral arterial disease     a. s/p lower extremity angiogram 1999, angioplasty leg and kidney 1999, bypass right leg 1999, aortobifemoral bypass grafting in 2009, right AKA  05/2013  . Hypertension   . Arthritis   . History of echocardiogram     a. ECHO 12/13/13 unable to assess LV fxn. severely limited study, needs to be reapeated w/ defintiy contrast  . Chronic systolic CHF (congestive heart failure)     a. LHC 12/16/13 with severely reduced LV systolic function with moderately to severely elevated left ventricular end-diastolic pressure  . Hyperlipidemia    Past Surgical History  Procedure  Laterality Date  . Carotid stent    . Leg amputation above knee    . Transurethral resection of prostate    . Angioplasty    . Coronary angioplasty    . Cholecystectomy    . Peripheral angiogram  12/14/2013    diamondback orbital rotational atherectomy, PTA and stent of high grade long segmental calcified proximal and mid left SFA stenosis with diamondback orbital rotational arthrectomy, I DEV stent using distal protection  . Left heart catheterization with coronary/graft angiogram N/A 12/09/2013    Procedure: LEFT HEART CATHETERIZATION WITH Beatrix Fetters;  Surgeon: Wellington Hampshire, MD;  Location: [facility] CATH LAB;  Service: Cardiovascular;  Laterality: N/A;  . Percutaneous coronary stent intervention (pci-s)  12/09/2013    Procedure: PERCUTANEOUS CORONARY STENT INTERVENTION (PCI-S);  Surgeon: Wellington Hampshire, MD;  Location: [facility] CATH LAB;  Service: Cardiovascular;;  . Abdominal angiogram N/A 12/10/2013    Procedure: ABDOMINAL ANGIOGRAM;  Surgeon: Lorretta Harp, MD;  Location: [facility] CATH LAB;  Service: Cardiovascular;  Laterality: N/A;  . Lower extremity angiogram N/A 12/10/2013    Procedure: LOWER EXTREMITY ANGIOGRAM;  Surgeon: Lorretta Harp, MD;  Location: [facility] CATH LAB;  Service: Cardiovascular;  Laterality: N/A;  . Percutaneous stent intervention Left 12/14/2013    Procedure: PERCUTANEOUS STENT INTERVENTION;  Surgeon: Lorretta Harp, MD;  Location: [facility] CATH LAB;  Service: Cardiovascular;  Laterality: Left;  lt sfa stent x2  . Lower extremity angiogram Left 12/14/2013    Procedure: LOWER EXTREMITY ANGIOGRAM;  Surgeon: Lorretta Harp, MD;  Location:  [facility] CATH LAB;  Service: Cardiovascular;  Laterality: Left;   No family history on file. History  Substance Use Topics  . Smoking status: Former Smoker    Quit date: 05/14/1984  . Smokeless tobacco: Not on file  . Alcohol Use: No    Review of Systems  All systems reviewed and negative, other than as noted in HPI.   Allergies   Chlorpazine; Metoclopramide; Oxycodone; and Tape  Home Medications   Prior to Admission medications   Medication Sig Start Date End Date Taking? Authorizing Provider  alum & mag hydroxide-simeth (MYLANTA) 200-200-20 MG/5ML suspension Take 15 mLs by mouth every 8 (eight) hours as needed for indigestion or heartburn.    Historical Provider, MD  amLODipine (NORVASC) 5 MG tablet Take 5 mg by mouth daily.    Historical Provider, MD  aspirin EC 81 MG tablet Take 1 tablet (81 mg total) by mouth daily. 12/16/13   Eileen Stanford, PA-C  atorvastatin (LIPITOR) 40 MG tablet Take 1 tablet (40 mg total) by mouth daily. 12/16/13   Eileen Stanford, PA-C  cloNIDine (CATAPRES) 0.1 MG tablet Take 0.1 mg by mouth 3 (three) times daily as needed (if sbp >175).    Historical Provider, MD  clopidogrel (PLAVIX) 75 MG tablet Take 1 tablet (75 mg total) by mouth daily with breakfast. 12/16/13   Eileen Stanford, PA-C  digoxin (LANOXIN) 0.25 MG tablet Take 0.25 mg by mouth daily.    Historical Provider, MD  docusate sodium (COLACE) 100 MG capsule Take 100 mg by mouth 2 (two) times daily.    Historical Provider, MD  dutasteride (AVODART) 0.5 MG capsule Take 0.5 mg by mouth daily.    Historical Provider, MD  ferrous sulfate 325 (65 FE) MG tablet Take 325 mg by mouth every evening.    Historical Provider, MD  furosemide (LASIX) 40 MG tablet Take 1 tablet (40 mg total) by mouth daily. 12/16/13   Eileen Stanford, PA-C  gabapentin (NEURONTIN) 300 MG capsule Take 1 capsule (300 mg total) by mouth three times daily, take 2 capsules (600 mg total) by mouth at bedtime.    Historical Provider, MD  HYDROcodone-acetaminophen (NORCO) 10-325 MG per tablet Take 1 tablet by mouth every 6 (six) hours as needed for moderate pain or severe pain.    Historical Provider, MD  lisinopril (PRINIVIL,ZESTRIL) 20 MG tablet Take 20 mg by mouth daily.    Historical Provider, MD  metoprolol (LOPRESSOR) 100 MG tablet Take 100 mg by mouth 2  (two) times daily.    Historical Provider, MD  nitroGLYCERIN (NITROSTAT) 0.4 MG SL tablet Place 0.4 mg under the tongue every 5 (five) minutes as needed for chest pain.    Historical Provider, MD  ranitidine (ZANTAC) 150 MG tablet Take 150 mg by mouth 2 (two) times daily.    Historical Provider, MD  rifampin (RIFADIN) 300 MG capsule Take 1 capsule (300 mg total) by mouth 2 (two) times daily. 12/16/13   Eileen Stanford, PA-C  vitamin C (ASCORBIC ACID) 500 MG tablet Take 500 mg by mouth 2 (two) times daily.    Historical Provider, MD  zinc sulfate 220 MG capsule Take 220 mg by mouth daily.    Historical Provider, MD   BP 144/61 mmHg  Pulse 63  Temp(Src) 97.8 F (36.6 C) (Oral)  Resp 16  SpO2 95% Physical Exam  Constitutional: He appears well-developed and well-nourished. No distress.  HENT:  Head: Normocephalic and atraumatic.  Eyes: Conjunctivae are normal. Right eye  exhibits no discharge. Left eye exhibits no discharge.  Neck: Neck supple.  Cardiovascular: Normal rate, regular rhythm and normal heart sounds.  Exam reveals no gallop and no friction rub.   No murmur heard. Pulmonary/Chest: Effort normal and breath sounds normal. No respiratory distress. He exhibits no tenderness.  Abdominal: Soft. He exhibits no distension. There is no tenderness.  Musculoskeletal: He exhibits no edema or tenderness.  R AKA  Neurological: He is alert.  Skin: Skin is warm and dry.  Psychiatric: He has a normal mood and affect. His behavior is normal. Thought content normal.  Nursing note and vitals reviewed.   ED Course  Procedures (including critical care time) Labs Review Labs Reviewed  BASIC METABOLIC PANEL - Abnormal; Notable for the following:    Glucose, Bld 248 (*)    GFR calc non Af Amer 53 (*)    GFR calc Af Amer 61 (*)    All other components within normal limits  CBC  DIGOXIN LEVEL  I-STAT TROPOININ, ED    Imaging Review Dg Chest Port 1 View  03/08/2015   CLINICAL DATA:   Left-sided chest pain since this morning.  EXAM: PORTABLE CHEST - 1 VIEW  COMPARISON:  12/09/2013  FINDINGS: The heart size and mediastinal contours are within normal limits. Both lungs are clear. The visualized skeletal structures are unremarkable. Prior CABG.  IMPRESSION: No acute abnormalities.   Electronically Signed   By: Lorriane Shire M.D.   On: 03/08/2015 12:24     EKG Interpretation   Date/Time:  Tuesday March 08 2015 11:18:37 EDT Ventricular Rate:  67 PR Interval:  220 QRS Duration: 123 QT Interval:  456 QTC Calculation: 481 R Axis:   34 Text Interpretation:  Sinus rhythm Prolonged PR interval Nonspecific  intraventricular conduction delay Non-specific ST-t changes Confirmed by  Wilson Singer  MD, Mckenzi Buonomo ([id number]) on 03/08/2015 11:24:21 AM      MDM   Final diagnoses:  Chest pain, unspecified chest pain type    81 year old male with chest pain. Exertional. Progressively worsening. More intense symptoms and being brought on by less and less exertion. Improved with nitroglycerin prior to arrival. Currently no pain. Received aspirin prehospital. EKG does not seem acutely changed. Initial troponin is normal. Given the progression of his symptoms, will discuss with cardiology.    Virgel Manifold, MD 03/08/15 [phone]

## 2015-03-08 NOTE — ED Notes (Signed)
Waiting on Cardiology.

## 2015-03-08 NOTE — H&P (Signed)
CARDIOLOGY CONSULT NOTE   Patient ID: Nicholas Bash Sr. MRN: [id number] DOB/AGE: 1925-06-27 81 y.o.  Admit date: 03/08/2015  Primary Physician   Woody Seller, MD Primary Cardiologist   Dr. Gwenlyn Found Reason for Consultation: chest pain  HPI: Nicholas Calzadilla Sr. is a 81 y.o. male with a history of CAD, PAD, carotid artery disease, HTN, DM, chronic systolic CHF, HLD who presented to [facility] ED for chest pain.  He had extensive CAD s/p angioplasty 1985, CABG x1 in 1996, heart attack in 2000 & 2001, PCTA sent 2004 & 2005, s/p 12/16/13 DES to SVG to LAD and found to have occluded RCA vein graft with left to right collaterals. OM1 PCI can be considered for symptoms, however never required as he was symptoms free until now.   He had extensive PAD s/p LE angiogram 1999, angioplasty leg and kidney 1999, bypass right leg 1999, aortobifemoral bypass grafting in 2009, right AKA 05/2013. Dopplers revealed a left ABI 0.9 widely patent left SFA 12/2013.  He had carotid dupplex 02/03/2015. Right proximal ICAs 5-69% diameter reduction, velocities suggested lower end of range. Left proximal ICA demonstrated a mild fibrous plaque w/o stenosis.  He had no chest pain since his last cath 11/2013 and never required to use nitro until today. He has been having intermittent cp with exertion for the past 2 weeks that has been getting worse. This AM he felt left sided chest pain/pressure which radiated to his left shoulder. He decided to call EMS as his cp is getting worse. He denies any associated SOB, Diaphoresis, nausea or vomiting. He was given nitro and 4 ASA  By EMS. He is symptoms free now.   He smoked 3-4 packs a day up until 1985 .   Past Medical History  Diagnosis Date  . Coronary artery disease     a. s/p angioplasty in 1985, coronary artery bypass grafting x1  in 1996, heart attack in 2000, PCTA/stent 2004 and 2005, s/p 12/16/13 DES to SVG to LAD   . Peripheral arterial disease     a. s/p lower extremity angiogram  1999, angioplasty leg and kidney 1999, bypass right leg 1999, aortobifemoral bypass grafting in 2009, right AKA  05/2013  . Hypertension   . Arthritis   . History of echocardiogram     a. ECHO 12/13/13 unable to assess LV fxn. severely limited study, needs to be reapeated w/ defintiy contrast  . Chronic systolic CHF (congestive heart failure)     a. LHC 12/16/13 with severely reduced LV systolic function with moderately to severely elevated left ventricular end-diastolic pressure  . Hyperlipidemia      Past Surgical History  Procedure Laterality Date  . Carotid stent    . Leg amputation above knee    . Transurethral resection of prostate    . Angioplasty    . Coronary angioplasty    . Cholecystectomy    . Peripheral angiogram  12/14/2013    diamondback orbital rotational atherectomy, PTA and stent of high grade long segmental calcified proximal and mid left SFA stenosis with diamondback orbital rotational arthrectomy, I DEV stent using distal protection  . Left heart catheterization with coronary/graft angiogram N/A 12/09/2013    Procedure: LEFT HEART CATHETERIZATION WITH Beatrix Fetters;  Surgeon: Wellington Hampshire, MD;  Location: [facility] CATH LAB;  Service: Cardiovascular;  Laterality: N/A;  . Percutaneous coronary stent intervention (pci-s)  12/09/2013    Procedure: PERCUTANEOUS CORONARY STENT INTERVENTION (PCI-S);  Surgeon: Wellington Hampshire, MD;  Location: [facility] CATH LAB;  Service: Cardiovascular;;  . Abdominal angiogram N/A 12/10/2013    Procedure: ABDOMINAL ANGIOGRAM;  Surgeon: Lorretta Harp, MD;  Location: [facility] CATH LAB;  Service: Cardiovascular;  Laterality: N/A;  . Lower extremity angiogram N/A 12/10/2013    Procedure: LOWER EXTREMITY ANGIOGRAM;  Surgeon: Lorretta Harp, MD;  Location: [facility] CATH LAB;  Service: Cardiovascular;  Laterality: N/A;  . Percutaneous stent intervention Left 12/14/2013    Procedure: PERCUTANEOUS STENT INTERVENTION;  Surgeon: Lorretta Harp, MD;  Location: [facility]  CATH LAB;  Service: Cardiovascular;  Laterality: Left;  lt sfa stent x2  . Lower extremity angiogram Left 12/14/2013    Procedure: LOWER EXTREMITY ANGIOGRAM;  Surgeon: Lorretta Harp, MD;  [facility];  Service: Cardiovascular;  Laterality: Left;  . Carotid dupplex  02/03/2015    Right proximal ICAs 5-69% diameter reduction, velocities suggested lower end of range. Left proximal ICA demonstrated a mild fibrous plaque w/o sternosis    Allergies  Allergen Reactions  . Chlorpazine [Prochlorperazine] Other (See Comments)    unknown  . Metoclopramide Other (See Comments)    hyperactivity  . Oxycodone Other (See Comments)    Causes confusion  . Tape Hives and Other (See Comments)    blisters    I have reviewed the patient's current medications .  morphine injection  4 mg Intravenous Once      Prior to Admission medications   Medication Sig Start Date End Date Taking? Authorizing Provider  acetaminophen (TYLENOL) 325 MG tablet Take 650 mg by mouth every 4 (four) hours as needed for mild pain.   Yes Historical Provider, MD  alum & mag hydroxide-simeth (MYLANTA) 200-200-20 MG/5ML suspension Take 15 mLs by mouth every 8 (eight) hours as needed for indigestion or heartburn.   Yes Historical Provider, MD  amLODipine (NORVASC) 5 MG tablet Take 5 mg by mouth daily.   Yes Historical Provider, MD  aspirin EC 81 MG tablet Take 1 tablet (81 mg total) by mouth daily. 12/16/13  Yes Eileen Stanford, PA-C  cloNIDine (CATAPRES) 0.1 MG tablet Take 0.1 mg by mouth 3 (three) times daily as needed (if sbp >175).   Yes Historical Provider, MD  clopidogrel (PLAVIX) 75 MG tablet Take 1 tablet (75 mg total) by mouth daily with breakfast. 12/16/13  Yes Eileen Stanford, PA-C  digoxin (LANOXIN) 0.125 MG tablet Take 0.125 mg by mouth daily.   Yes Historical Provider, MD  docusate sodium (COLACE) 100 MG capsule Take 100 mg by mouth 2 (two) times daily.   Yes Historical Provider, MD  DULoxetine (CYMBALTA)  30 MG capsule Take 30 mg by mouth daily.   Yes Historical Provider, MD  ferrous sulfate 325 (65 FE) MG tablet Take 325 mg by mouth every evening.   Yes Historical Provider, MD  finasteride (PROSCAR) 5 MG tablet Take 5 mg by mouth daily.   Yes Historical Provider, MD  furosemide (LASIX) 40 MG tablet Take 1 tablet (40 mg total) by mouth daily. 12/16/13  Yes Eileen Stanford, PA-C  gabapentin (NEURONTIN) 100 MG capsule Take 100-300 mg by mouth See admin instructions. Take 100mg  in the morning and afternoon, take 300mg  at bedtime as directed.   Yes Historical Provider, MD  HYDROcodone-acetaminophen (NORCO) 10-325 MG per tablet Take 1 tablet by mouth 3 (three) times daily.    Yes Historical Provider, MD  insulin glargine (LANTUS) 100 UNIT/ML injection Inject 16 Units into the skin daily.   Yes Historical Provider, MD  lidocaine (LIDODERM) 5 % Place 1  patch onto the skin daily as needed (pain). Remove & Discard patch within 12 hours or as directed by MD   Yes Historical Provider, MD  lisinopril (PRINIVIL,ZESTRIL) 20 MG tablet Take 20 mg by mouth daily.   Yes Historical Provider, MD  metoprolol (LOPRESSOR) 100 MG tablet Take 100 mg by mouth 2 (two) times daily.   Yes Historical Provider, MD  multivitamin-lutein (OCUVITE-LUTEIN) CAPS capsule Take 1 capsule by mouth daily.   Yes Historical Provider, MD  nitroGLYCERIN (NITROSTAT) 0.4 MG SL tablet Place 0.4 mg under the tongue every 5 (five) minutes as needed for chest pain.   Yes Historical Provider, MD  polyethylene glycol (MIRALAX / GLYCOLAX) packet Take 17 g by mouth daily.   Yes Historical Provider, MD  ranitidine (ZANTAC) 150 MG tablet Take 150 mg by mouth 2 (two) times daily.   Yes Historical Provider, MD  atorvastatin (LIPITOR) 40 MG tablet Take 1 tablet (40 mg total) by mouth daily. Patient not taking: Reported on 03/08/2015 12/16/13   Eileen Stanford, PA-C  rifampin (RIFADIN) 300 MG capsule Take 1 capsule (300 mg total) by mouth 2 (two) times  daily. Patient not taking: Reported on 03/08/2015 12/16/13   Eileen Stanford, PA-C  zinc sulfate 220 MG capsule Take 220 mg by mouth daily.    Historical Provider, MD     History   Social History  . Marital Status: Widowed    Spouse Name: N/A  . Number of Children: N/A  . Years of Education: N/A   Occupational History  . Not on file.   Social History Main Topics  . Smoking status: Former Smoker    Quit date: 05/14/1984  . Smokeless tobacco: Not on file  . Alcohol Use: No  . Drug Use: No  . Sexual Activity: Not on file   Other Topics Concern  . Not on file   Social History Narrative    No family status information on file.   No family history on file.   ROS:  Full 14 point review of systems complete and found to be negative unless listed above.  Physical Exam: Blood pressure 155/57, pulse 47, temperature 97.8 F (36.6 C), temperature source Oral, resp. rate 14, SpO2 94 %.  General: Well developed, well nourished, male in no acute distress Head: Eyes PERRLA, No xanthomas.   Normocephalic and atraumatic, oropharynx without edema or exudate. Dentition:  Lungs:  Heart: HRRR S1 S2, no rub/gallop, Heart irregular rate and rhythm with S1, S2  murmur. Neck: No carotid bruits. No JVD. Abdomen: Bowel sounds present, abdomen soft and non-tender without masses or hernias noted. Msk:  No spine or cva tenderness. No weakness, no joint deformities or effusions. Extremities: No clubbing or cyanosis.  No edema. AKA.  Neuro: Alert and oriented X 3. No focal deficits noted. Psych:  Good affect, responds appropriately Skin: No rashes or lesions noted.  Labs:   Lab Results  Component Value Date   WBC 8.2 03/08/2015   HGB 13.1 03/08/2015   HCT 39.2 03/08/2015   MCV 85.4 03/08/2015   PLT 100* 03/08/2015   No results for input(s): INR in the last 72 hours.   Recent Labs Lab 03/08/15 1134  NA 135  K 4.1  CL 100  CO2 24  BUN 22  CREATININE 1.19  CALCIUM 9.4  GLUCOSE 248*     Recent Labs  03/08/15 1140  TROPIPOC 0.04    Lab Results  Component Value Date   CHOL 128 12/10/2013  HDL 30* 12/10/2013   LDLCALC 74 12/10/2013   TRIG 120 12/10/2013    Cath 11/2013: 1. Severe three-vessel coronary artery disease with chronically occluded LAD and RCA. Occluded SVG to RCA but the vessel gives collaterals from the left circumflex. The SVG to LAD is patent but has severe 99% hazy stenosis in the midsegment. There is also severe disease in proximal OM 1 2. Severely reduced LV systolic function with moderately to severely elevated left ventricular end-diastolic pressure 3. Successful drug-eluting stent placement to the SVG to LAD. Slow flow was treated successfully with intra coronary adenosine.  Echo: 12/13/2013 Left ventricle: The cavity size was normal. Wall thickness was increased in a pattern of moderate LVH. Systolic function was normal. The estimated ejection fraction was in the range of 50% to 55%. There was an increased relative contribution of atrial contraction to ventricular filling. The quality of this echo is very poor and accurate assessment of the LV function is not possible with this echo. a repeat limited echo with Definity contrast would help visualize the LV endocardium.  LHC 12/16/13 with severely reduced LV systolic function with moderately to severely elevated left ventricular end-diastolic pressure  ECG:  NSR with HR 67. TWI in II and ST depression in V6 - no chance from last EKG  Radiology:  Dg Chest [facility]  03/08/2015   CLINICAL DATA:  Left-sided chest pain since this morning.  EXAM: PORTABLE CHEST - 1 VIEW  COMPARISON:  12/09/2013  FINDINGS: The heart size and mediastinal contours are within normal limits. Both lungs are clear. The visualized skeletal structures are unremarkable. Prior CABG.  IMPRESSION: No acute abnormalities.   Electronically Signed   By: Lorriane Shire M.D.   On: 03/08/2015 12:24    ASSESSMENT AND PLAN:    Principal  Problem:   Chest pain on exertion Active Problems:   Coronary artery disease   Peripheral arterial disease   Hypertension   Hyperlipidemia   Carotid artery disease  81 y.o. male with a history of CAD, PAD, carotid artery disease, HTN, chronic systolic CHF, HLD who presented for chest pain. Trop negative. Cr 1.19.  1. Chest Pain - Extensive hx of CAD. He was symptoms free until 2 weeks ago that has been getting worse. Initial trop is negative. EKG without acute abnormality. We will admit for observation and cath tomorrow. Will get trop trent. He was recommenced for OM1 PCI for symptoms after cath 11/2013, however never required.  - Start heparin and nitro PRN.   SignedLeanor Kail, [place] 03/08/2015 4:26 PM  Pager [phone]  Co-Sign MD  Attending Note:   The patient was seen and examined.  Agree with assessment and plan as noted above.  Changes made to the above note as needed.  Pt presents with 2 weeks of progressive angina.  The symptoms are very similar to his typical angina.  Symptoms occur with any exertion.  Given the symptoms, I think he needs to go for cath . Will start heparin.   Add NTG if he has more CP tonight.   Thayer Headings, Brooke Bonito., MD, [facility] 03/09/2015, 6:15 AM [address]  [place] Pager [phone]

## 2015-03-08 NOTE — ED Notes (Signed)
Cardiology at bedside. Dr Cathie Olden

## 2015-03-08 NOTE — ED Notes (Signed)
3w03

## 2015-03-08 NOTE — Progress Notes (Signed)
Pt has DNR form in Chart

## 2015-03-08 NOTE — Progress Notes (Signed)
ANTICOAGULATION CONSULT NOTE - Initial Consult  Pharmacy Consult for heparin Indication: chest pain/ACS  Allergies  Allergen Reactions  . Chlorpazine [Prochlorperazine] Other (See Comments)    unknown  . Metoclopramide Other (See Comments)    hyperactivity  . Oxycodone Other (See Comments)    Causes confusion  . Tape Hives and Other (See Comments)    blisters    Patient Measurements:    Vital Signs: Temp: 97.8 F (36.6 C) (04/12 1121) Temp Source: Oral (04/12 1121) BP: 154/57 mmHg (04/12 1700) Pulse Rate: 62 (04/12 1700)  Labs:  Recent Labs  03/08/15 1134 03/08/15 1636  HGB 13.1  --   HCT 39.2  --   PLT 100*  --   CREATININE 1.19  --   TROPONINI  --  0.05*    CrCl cannot be calculated (Unknown ideal weight.).   Medical History: Past Medical History  Diagnosis Date  . Coronary artery disease     a. s/p angioplasty in 1985, coronary artery bypass grafting x1  in 1996, heart attack in 2000, PCTA/stent 2004 and 2005, s/p 12/16/13 DES to SVG to LAD   . Peripheral arterial disease     a. s/p lower extremity angiogram 1999, angioplasty leg and kidney 1999, bypass right leg 1999, aortobifemoral bypass grafting in 2009, right AKA  05/2013  . Hypertension   . Arthritis   . History of echocardiogram     a. ECHO 12/13/13 unable to assess LV fxn. severely limited study, needs to be reapeated w/ defintiy contrast  . Chronic systolic CHF (congestive heart failure)     a. LHC 12/16/13 with severely reduced LV systolic function with moderately to severely elevated left ventricular end-diastolic pressure  . Hyperlipidemia   . Diabetes mellitus     Medications:   (Not in a hospital admission)  Assessment: 4 yoM with an extensive history of CAD presents with intermittent chest pain x1 week. Plan for cath tomorrow. Pharmacy consulted to dose heparin for ACS. Initial trop 0.04, H/H wnl, platelets 100. Wt ~68 kg (per patient)  Goal of Therapy:  Heparin level 0.3-0.7  units/ml Monitor platelets by anticoagulation protocol: Yes   Plan:  Heparin 4000 unit IV bolus, then 800 unit/hr 8 hour HL (0200) Daily HL and CBC Monitor for signs of bleeding  Whitney Muse D 03/08/2015,5:31 PM

## 2015-03-08 NOTE — ED Notes (Signed)
Per EMS, Patient is complaining of 2/10 stabbing shooting pain in the center of his chest. Patient reports pain only during activity. He was walking when the pain came on this morning. Patient has history of MI. EKG showed some depression. Patient was given 324 mg of Aspirin and 0.4 mg of SL NItro. Vitals per EMS: 166/90 Initial, Last 152/70, 66 HR, 96 % on RA, 304 CBG.

## 2015-03-08 NOTE — ED Notes (Signed)
Cardiology at the bedside.

## 2015-03-08 NOTE — ED Notes (Signed)
Patient reports walking back to his room and having a sudden onset of shooting/stabbing pain in his central chest. Patient's pain decreased when EMS arrived, but he had a second episode when moving to stretcher. Patient denies any pain at this time.

## 2015-03-08 NOTE — ED Notes (Signed)
Family at the bedside. Waiting on [place]. Xray at the bedside

## 2015-03-09 ENCOUNTER — Encounter (HOSPITAL_COMMUNITY)
Admission: EM | Disposition: A | Discharge status: Skilled Nursing Facility | Payer: Medicare Other | Source: Home / Self Care | Attending: Cardiovascular Disease

## 2015-03-09 ENCOUNTER — Encounter (HOSPITAL_COMMUNITY): Payer: Self-pay | Admitting: Interventional Cardiology

## 2015-03-09 DIAGNOSIS — T82898A Other specified complication of vascular prosthetic devices, implants and grafts, initial encounter: Secondary | ICD-10-CM | POA: Diagnosis present

## 2015-03-09 DIAGNOSIS — E785 Hyperlipidemia, unspecified: Secondary | ICD-10-CM | POA: Diagnosis not present

## 2015-03-09 DIAGNOSIS — E119 Type 2 diabetes mellitus without complications: Secondary | ICD-10-CM | POA: Diagnosis present

## 2015-03-09 DIAGNOSIS — I25110 Atherosclerotic heart disease of native coronary artery with unstable angina pectoris: Secondary | ICD-10-CM | POA: Diagnosis present

## 2015-03-09 DIAGNOSIS — R079 Chest pain, unspecified: Secondary | ICD-10-CM | POA: Diagnosis present

## 2015-03-09 DIAGNOSIS — T82858A Stenosis of vascular prosthetic devices, implants and grafts, initial encounter: Secondary | ICD-10-CM | POA: Diagnosis present

## 2015-03-09 DIAGNOSIS — I2582 Chronic total occlusion of coronary artery: Secondary | ICD-10-CM | POA: Diagnosis present

## 2015-03-09 DIAGNOSIS — I25710 Atherosclerosis of autologous vein coronary artery bypass graft(s) with unstable angina pectoris: Secondary | ICD-10-CM | POA: Diagnosis not present

## 2015-03-09 DIAGNOSIS — I739 Peripheral vascular disease, unspecified: Secondary | ICD-10-CM | POA: Diagnosis present

## 2015-03-09 DIAGNOSIS — I10 Essential (primary) hypertension: Secondary | ICD-10-CM | POA: Diagnosis present

## 2015-03-09 DIAGNOSIS — I5042 Chronic combined systolic (congestive) and diastolic (congestive) heart failure: Secondary | ICD-10-CM | POA: Diagnosis present

## 2015-03-09 DIAGNOSIS — I2510 Atherosclerotic heart disease of native coronary artery without angina pectoris: Secondary | ICD-10-CM | POA: Diagnosis not present

## 2015-03-09 DIAGNOSIS — Z7982 Long term (current) use of aspirin: Secondary | ICD-10-CM | POA: Diagnosis not present

## 2015-03-09 DIAGNOSIS — Z66 Do not resuscitate: Secondary | ICD-10-CM | POA: Diagnosis present

## 2015-03-09 DIAGNOSIS — Z87891 Personal history of nicotine dependence: Secondary | ICD-10-CM | POA: Diagnosis not present

## 2015-03-09 DIAGNOSIS — Z89611 Acquired absence of right leg above knee: Secondary | ICD-10-CM | POA: Diagnosis not present

## 2015-03-09 DIAGNOSIS — Z7902 Long term (current) use of antithrombotics/antiplatelets: Secondary | ICD-10-CM | POA: Diagnosis not present

## 2015-03-09 DIAGNOSIS — Y832 Surgical operation with anastomosis, bypass or graft as the cause of abnormal reaction of the patient, or of later complication, without mention of misadventure at the time of the procedure: Secondary | ICD-10-CM | POA: Diagnosis present

## 2015-03-09 HISTORY — PX: LEFT HEART CATHETERIZATION WITH CORONARY ANGIOGRAM: SHX5451

## 2015-03-09 HISTORY — PX: PERCUTANEOUS CORONARY STENT INTERVENTION (PCI-S): SHX5485

## 2015-03-09 LAB — GLUCOSE, CAPILLARY
Glucose-Capillary: 144 mg/dL — ABNORMAL HIGH (ref 70–99)
Glucose-Capillary: 201 mg/dL — ABNORMAL HIGH (ref 70–99)

## 2015-03-09 LAB — BASIC METABOLIC PANEL
Anion gap: 13 (ref 5–15)
BUN: 24 mg/dL — AB (ref 6–23)
CO2: 22 mmol/L (ref 19–32)
Calcium: 9.1 mg/dL (ref 8.4–10.5)
Chloride: 100 mmol/L (ref 96–112)
Creatinine, Ser: 1.08 mg/dL (ref 0.50–1.35)
GFR calc non Af Amer: 60 mL/min — ABNORMAL LOW (ref >90)
GFR, EST AFRICAN AMERICAN: 69 mL/min — AB (ref >90)
Glucose, Bld: 120 mg/dL — ABNORMAL HIGH (ref 70–99)
POTASSIUM: 3.9 mmol/L (ref 3.5–5.1)
SODIUM: 135 mmol/L (ref 135–145)

## 2015-03-09 LAB — CBC
HCT: 37.3 % — ABNORMAL LOW (ref 39.0–52.0)
HEMOGLOBIN: 13 g/dL (ref 13.0–17.0)
MCH: 28.6 pg (ref 26.0–34.0)
MCHC: 33.8 g/dL (ref 30.0–36.0)
MCV: 84.6 fL (ref 78.0–100.0)
Platelets: 109 10*3/uL — ABNORMAL LOW (ref 150–400)
RBC: 4.41 MIL/uL (ref 4.22–5.81)
RDW: 15 % (ref 11.5–15.5)
WBC: 8.3 10*3/uL (ref 4.0–10.5)

## 2015-03-09 LAB — HEPARIN LEVEL (UNFRACTIONATED)
Heparin Unfractionated: <0.1 IU/mL — ABNORMAL LOW (ref 0.30–0.70)
Heparin Unfractionated: <0.1 IU/mL — ABNORMAL LOW (ref 0.30–0.70)

## 2015-03-09 LAB — LIPID PANEL
CHOL/HDL RATIO: 4.3 ratio
Cholesterol: 111 mg/dL (ref 0–200)
HDL: 26 mg/dL — AB (ref >39)
LDL CALC: 19 mg/dL (ref 0–99)
TRIGLYCERIDES: 332 mg/dL — AB (ref <150)
VLDL: 66 mg/dL — AB (ref 0–40)

## 2015-03-09 LAB — TROPONIN I: Troponin I: 0.05 ng/mL — ABNORMAL HIGH (ref <0.031)

## 2015-03-09 LAB — PROTIME-INR
INR: 1.09 (ref 0.00–1.49)
Prothrombin Time: 14.3 seconds (ref 11.6–15.2)

## 2015-03-09 LAB — POCT ACTIVATED CLOTTING TIME: Activated Clotting Time: 466 seconds

## 2015-03-09 SURGERY — LEFT HEART CATHETERIZATION WITH CORONARY ANGIOGRAM
Anesthesia: LOCAL

## 2015-03-09 MED ORDER — SODIUM CHLORIDE 0.9 % IV SOLN: 250.0000 mL | INTRAVENOUS | Status: DC | PRN

## 2015-03-09 MED ORDER — INSULIN ASPART 100 UNIT/ML ~~LOC~~ SOLN
0.0000 [IU] | Freq: Every day | SUBCUTANEOUS | Status: DC
  Administered 2015-03-09: 2 [IU] via SUBCUTANEOUS

## 2015-03-09 MED ORDER — LIDOCAINE HCL (PF) 1 % IJ SOLN
INTRAMUSCULAR | Status: AC
  Filled 2015-03-09: qty 30

## 2015-03-09 MED ORDER — MIDAZOLAM HCL 2 MG/2ML IJ SOLN
INTRAMUSCULAR | Status: AC
Start: 2015-03-09 — End: 2015-03-09
  Filled 2015-03-09: qty 2

## 2015-03-09 MED ORDER — VERAPAMIL HCL 2.5 MG/ML IV SOLN
INTRAVENOUS | Status: AC
  Filled 2015-03-09: qty 2

## 2015-03-09 MED ORDER — HEPARIN SODIUM (PORCINE) 1000 UNIT/ML IJ SOLN
INTRAMUSCULAR | Status: AC
  Filled 2015-03-09: qty 1

## 2015-03-09 MED ORDER — HEPARIN (PORCINE) IN NACL 2-0.9 UNIT/ML-% IJ SOLN
INTRAMUSCULAR | Status: AC
  Filled 2015-03-09: qty 1000

## 2015-03-09 MED ORDER — FENTANYL CITRATE 0.05 MG/ML IJ SOLN
INTRAMUSCULAR | Status: AC
  Filled 2015-03-09: qty 2

## 2015-03-09 MED ORDER — BIVALIRUDIN 250 MG IV SOLR
INTRAVENOUS | Status: AC
  Filled 2015-03-09: qty 250

## 2015-03-09 MED ORDER — NITROGLYCERIN IN D5W 200-5 MCG/ML-% IV SOLN
10.0000 ug/min | INTRAVENOUS | Status: AC
  Administered 2015-03-09: 10 ug/min via INTRAVENOUS

## 2015-03-09 MED ORDER — ASPIRIN 81 MG PO CHEW
81.0000 mg | CHEWABLE_TABLET | ORAL | Status: DC
Start: 2015-03-10 — End: 2015-03-09

## 2015-03-09 MED ORDER — INSULIN GLARGINE 100 UNIT/ML ~~LOC~~ SOLN
8.0000 [IU] | Freq: Two times a day (BID) | SUBCUTANEOUS | Status: AC
  Administered 2015-03-09 (×2): 8 [IU] via SUBCUTANEOUS
  Filled 2015-03-09 (×3): qty 0.08

## 2015-03-09 MED ORDER — ASPIRIN 81 MG PO CHEW
81.0000 mg | CHEWABLE_TABLET | Freq: Every day | ORAL | Status: DC
  Administered 2015-03-10: 81 mg via ORAL
  Filled 2015-03-09: qty 1

## 2015-03-09 MED ORDER — INSULIN GLARGINE 100 UNIT/ML ~~LOC~~ SOLN
16.0000 [IU] | Freq: Every day | SUBCUTANEOUS | Status: DC
  Filled 2015-03-09: qty 0.16

## 2015-03-09 MED ORDER — CLOPIDOGREL BISULFATE 75 MG PO TABS
ORAL_TABLET | ORAL | Status: AC
  Filled 2015-03-09: qty 1

## 2015-03-09 MED ORDER — SODIUM CHLORIDE 0.9 % IJ SOLN: 3.0000 mL | INTRAMUSCULAR | Status: DC | PRN

## 2015-03-09 MED ORDER — INSULIN ASPART 100 UNIT/ML ~~LOC~~ SOLN
0.0000 [IU] | Freq: Three times a day (TID) | SUBCUTANEOUS | Status: DC
  Administered 2015-03-10 (×2): 1 [IU] via SUBCUTANEOUS

## 2015-03-09 MED ORDER — SODIUM CHLORIDE 0.9 % IJ SOLN
3.0000 mL | Freq: Two times a day (BID) | INTRAMUSCULAR | Status: DC
  Administered 2015-03-09: 3 mL via INTRAVENOUS

## 2015-03-09 MED ORDER — CLOPIDOGREL BISULFATE 75 MG PO TABS: 75.0000 mg | ORAL_TABLET | Freq: Every day | ORAL | Status: DC

## 2015-03-09 MED ORDER — SODIUM CHLORIDE 0.9 % IV SOLN
INTRAVENOUS | Status: AC
  Administered 2015-03-09: 75 mL/h via INTRAVENOUS

## 2015-03-09 MED ORDER — MIDAZOLAM HCL 2 MG/2ML IJ SOLN
INTRAMUSCULAR | Status: AC
  Filled 2015-03-09: qty 2

## 2015-03-09 MED ORDER — HEPARIN BOLUS VIA INFUSION
2000.0000 [IU] | Freq: Once | INTRAVENOUS | Status: AC
Start: 2015-03-09 — End: 2015-03-09
  Administered 2015-03-09: 2000 [IU] via INTRAVENOUS
  Filled 2015-03-09: qty 2000

## 2015-03-09 NOTE — Progress Notes (Signed)
UR completed 

## 2015-03-09 NOTE — CV Procedure (Signed)
Left Heart Catheterization with Coronary and Bypass Angiography and PCI Report  Nicholas Bash Sr.  81 y.o.  male 1925/08/30  Procedure Date: 03/09/2015 Referring Physician: Joaquim Nam, MD Primary Cardiologist: B. Crenshaw, MD  INDICATIONS: Unstable angina pectoris  PROCEDURE: 1. Left heart catheterization; 2. Coronary angiography; 3. Left ventriculography; 4. Bypass graft angiography; 5. PTCA SVG to LAD  CONSENT:  The risks, benefits, and details of the procedure were explained in detail to the patient. Risks including death, stroke, heart attack, kidney injury, allergy, limb ischemia, bleeding and radiation injury were discussed.  The patient verbalized understanding and wanted to proceed.  Informed written consent was obtained.  PROCEDURE TECHNIQUE:  After Xylocaine anesthesia a 5 French Slender sheath was placed in the right radial artery with an angiocath and the modified Seldinger technique.  Coronary angiography was done using a 5 F JR 4 and JL 3.5 cm diagnostic catheter.  Left ventriculography was done using the JR 4 catheter and hand injection.   Review of the images demonstrated that the major change since the PCI in 2014 was set there is recurrent in-stent restenosis at the site of the previous intervention. The graft is otherwise diffusely diseased in the mid segment. There is severe native vessel disease with total occlusion of the RCA, total occlusion of the LAD, and high grade disease in the obtuse marginal branches of the circumflex. There is also left main disease. The patient has a right above-the-knee amputation. He is 81 years of age. He is not a surgical candidate in my opinion and after talking to the son, the patient is not likely to consider bypass. We decided to perform palliative angioplasty on the in-stent restenosis lesion in the LAD.   After multiple catheter exchanges, we used a 0.75 left Amplatz guide catheter to obtain guiding shots and perform the  intervention. Bivalirudin bolus and infusion was started. ACT was documented to be greater than 300. An additional 150 mg of Plavix was administered orally. We have asked a BMW wire into the distal LAD via the saphenous vein graft to the LAD. We used a 15 mm x 4.0 mm Emerge Balloon and performed angioplasty on the in-stent restenosis with several overlapping balloon inflations up to 12 atm. The patient had severe chest discomfort with angioplasty. He also had transient no reflow after removal of the balloon. 400 g of verapamil and 200 g of nitroglycerin were given into the saphenous vein graft in 100 g in doses. This restored brisk TIMI grade 3 flow. During no reflow the patient has significant chest and arm discomfort. After disengagement of the artery at completion of the case, the discomfort gradually resolved. No EKG changes were noted.  The case was terminated and a right radial wrist band was used to achieve hemostasis.   CONTRAST:  Total of 175 cc.  COMPLICATIONS:  Transient no reflow treated with pharmacology with prompt return of TIMI grade 3 flow   HEMODYNAMICS:  Aortic pressure 156/52 mmHg; LV pressure 162/11 mmHg; LVEDP 18 mmHg  ANGIOGRAPHIC DATA:   The left main coronary artery is heavily calcified and contains a proximal and mid 50% narrowing.  The left anterior descending artery is severely diseased. There is ostial 75-80% narrowing. After the second septal perforator the LAD is totally occluded. A moderate size first diagonal contains 99% stenosis.  The left circumflex artery is is severely diseased with ostial and proximal 50-70% narrowing, the large first obtuse marginal contains 85-90% proximal narrowing. The large second obtuse  marginal contains eccentric 70% narrowing.  The right coronary artery is totally occluded in the mid vessel. The distal right coronary which is dominant feels by collaterals from left-to-right.  SAPHENOUS VEIN ANGIOGRAPHY:  The SVG to the RCA is  totally occluded.  The SVG to the LAD is severely and diffusely diseased. The proximal segment which contains overlapping stents has a region of relatively focal 99% in-stent restenosis. This is in the same region as the previous catheterization in 2014 which was treated with restenting. The mid graft is severely and diffusely diseased with up to 50-70% narrowing in multiple spots.  PCI RESULTS: The proximal region of severe in-stent restenosis within the saphenous vein graft was treated with balloon angioplasty using a 15 x 4.0 mm Emerge Balloon. Multiple inflations up to 12 atm were performed. Final angiography demonstrated reduction in stenosis from 99% to less than 20% with TIMI grade 3 flow. Patient had transient no reflow. This was treated with pharmacology as outlined above.  LEFT VENTRICULOGRAM:  Left ventricular angiogram was done in the 30 RAO projection and revealed moderate inferior wall hypokinesis. Mild anteroapical hypokinesis. EF 50%.   IMPRESSIONS:  1. Unstable angina due to recurrent in-stent restenosis in the aged saphenous vein graft to the LAD.  2. Successful balloon angioplasty of the region of restenosis within the saphenous vein graft. This is considered to be a palliative procedure.  3. Total occlusion of saphenous vein graft to the right coronary  4. Severe three-vessel coronary disease with total occlusion of native RCA, total occlusion of the mid LAD, high-grade obstruction in each of 2 large obtuse marginal branches, and moderately severe left main disease.  5. Left ventricular ejection fraction is 50% and there is hemodynamic evidence of diastolic heart failure   RECOMMENDATION:  Continue aspirin and Plavix.  The current procedure was done in a palliative mindset. I do not believe the patient is candidate for surgery. He has had restenosis in an aged saphenous vein graft 2 and further stenting did not seem prudent.  Consider formal discussion of end-of-life and  palliative care concerns prior to discharge.  Overall cardiovascular outlook is quite poor and I would recommend conservative medical management going forward.

## 2015-03-09 NOTE — Progress Notes (Signed)
ANTICOAGULATION CONSULT NOTE - Follow Up Consult  Pharmacy Consult for heparin Indication: chest pain/ACS   Labs:  Recent Labs  03/08/15 1134 03/08/15 1636 03/08/15 1856 03/09/15 0231  HGB 13.1  --   --  13.0  HCT 39.2  --   --  37.3*  PLT 100*  --   --  PENDING  LABPROT  --   --   --  14.3  INR  --   --   --  1.09  HEPARINUNFRC  --   --   --  <0.10*  CREATININE 1.19  --   --  1.08  TROPONINI  --  0.05* 0.04* 0.05*     Assessment: 81yo male undetectable on heparin with initial dosing for CP.  Goal of Therapy:  Heparin level 0.3-0.7 units/ml    Plan:  Will rebolus with heparin 2000 units and increase gtt by 4 units/kg/hr to 1100 units/hr and check level in [place], PharmD, BCPS  03/09/2015,3:41 AM

## 2015-03-09 NOTE — Progress Notes (Signed)
PROGRESS NOTE  Subjective:   Pt was admitted with progressive CP with exertion. Scheduled for cath later today   Objective:    Vital Signs:   Temp:  [97.7 F (36.5 C)-97.8 F (36.6 C)] 97.8 F (36.6 C) (04/13 0547) Pulse Rate:  [47-87] 58 (04/13 0547) Resp:  [12-20] 18 (04/13 0547) BP: (112-158)/(53-96) 142/78 mmHg (04/13 0547) SpO2:  [91 %-98 %] 98 % (04/13 0547) Weight:  [148 lb 5.9 oz (67.3 kg)-150 lb (68.04 kg)] 150 lb (68.04 kg) (04/13 0547)  Last BM Date: 03/07/15   24-hour weight change: Weight change:   Weight trends: Filed Weights   03/08/15 1700 03/08/15 1827 03/09/15 0547  Weight: 149 lb 14.6 oz (68 kg) 148 lb 5.9 oz (67.3 kg) 150 lb (68.04 kg)    Intake/Output:  04/12 0701 - 04/13 0700 In: -  Out: 2100 [Urine:2100]     Physical Exam: BP 142/78 mmHg  Pulse 58  Temp(Src) 97.8 F (36.6 C) (Oral)  Resp 18  Ht 5\' 10"  (1.778 m)  Wt 150 lb (68.04 kg)  BMI 21.52 kg/m2  SpO2 98%  Wt Readings from Last 3 Encounters:  03/09/15 150 lb (68.04 kg)  05/25/14 143 lb 6.4 oz (65.046 kg)  01/27/14 141 lb (63.957 kg)    General: Vital signs reviewed and noted.   Head: Normocephalic, atraumatic.  Eyes: conjunctivae/corneas clear.  EOM's intact.   Throat: normal  Neck:  normal   Lungs:    clear   Heart:  RR   Abdomen:  Soft, non-tender, non-distended    Extremities: Good pulses    Neurologic: A&O X3, CN II - XII are grossly intact.   Psych: Normal     Labs: BMET:  Recent Labs  03/08/15 1134 03/09/15 0231  NA 135 135  K 4.1 3.9  CL 100 100  CO2 24 22  GLUCOSE 248* 120*  BUN 22 24*  CREATININE 1.19 1.08  CALCIUM 9.4 9.1    Liver function tests: No results for input(s): AST, ALT, ALKPHOS, BILITOT, PROT, ALBUMIN in the last 72 hours. No results for input(s): LIPASE, AMYLASE in the last 72 hours.  CBC:  Recent Labs  03/08/15 1134 03/09/15 0231  WBC 8.2 8.3  HGB 13.1 13.0  HCT 39.2 37.3*  MCV 85.4 84.6  PLT 100* 109*     Cardiac Enzymes:  Recent Labs  03/08/15 1636 03/08/15 1856 03/09/15 0231  TROPONINI 0.05* 0.04* 0.05*    Coagulation Studies:  Recent Labs  03/09/15 0231  LABPROT 14.3  INR 1.09    Other: Invalid input(s): POCBNP No results for input(s): DDIMER in the last 72 hours. No results for input(s): HGBA1C in the last 72 hours.  Recent Labs  03/09/15 0231  CHOL 111  HDL 26*  LDLCALC 19  TRIG 332*  CHOLHDL 4.3    Recent Labs  03/08/15 1856  TSH 2.065   No results for input(s): VITAMINB12, FOLATE, FERRITIN, TIBC, IRON, RETICCTPCT in the last 72 hours.   Other results:  EKG  ( personally reviewed )  - NSR TIW in inferior and anterior lateral leads   Medications:    Infusions: . sodium chloride 75 mL/hr at 03/09/15 0347  . heparin 1,100 Units/hr (03/09/15 0346)    Scheduled Medications: . amLODipine  5 mg Oral Daily  . aspirin  324 mg Oral NOW   Or  . aspirin  300 mg Rectal NOW  . [START ON 03/10/2015] aspirin  81 mg Oral Pre-Cath  .  aspirin EC  81 mg Oral Daily  . atorvastatin  40 mg Oral Daily  . clopidogrel  75 mg Oral Q breakfast  . digoxin  0.125 mg Oral Daily  . docusate sodium  100 mg Oral BID  . DULoxetine  30 mg Oral Daily  . famotidine  20 mg Oral Daily  . ferrous sulfate  325 mg Oral QPM  . finasteride  5 mg Oral Daily  . gabapentin  100 mg Oral BID AC  . gabapentin  300 mg Oral QHS  . HYDROcodone-acetaminophen  1 tablet Oral TID  . lisinopril  20 mg Oral Daily  . metoprolol  100 mg Oral BID  .  morphine injection  4 mg Intravenous Once  . rifampin  300 mg Oral BID  . sodium chloride  3 mL Intravenous Q12H    Assessment/ Plan:   Principal Problem:   Chest pain on exertion Active Problems:   Coronary artery disease   Peripheral arterial disease   Hypertension   Hyperlipidemia   Carotid artery disease   Diabetes mellitus  1. CAD:  Has extensive CAD hx.  For cath today. No further CP Troponin levels are mildly elevated   2.  Essential Hypertension:  BP is minimally elevated .  Continue meds.   3. Hyperlipidemia: continue meds  4.  PAD   5. DM :  Will get 1/2 his usual Lantus insulin today  Disposition:  For cath today  Length of Stay:   Ramond Dial., MD, [facility] 03/09/2015, 9:36 AM Office [phone] Pager [phone]

## 2015-03-09 NOTE — Progress Notes (Signed)
Inpatient Diabetes Program Recommendations  AACE/ADA: New Consensus Statement on Inpatient Glycemic Control (2013)  Target Ranges:  Prepandial:   less than 140 mg/dL      Peak postprandial:   less than 180 mg/dL (1-2 hours)      Critically ill patients:  140 - 180 mg/dL   Results for ZEV, BLUE SR. (MRN [id number]) as of 03/09/2015 09:00  Ref. Range 03/08/2015 18:40 03/08/2015 21:06  Glucose-Capillary Latest Ref Range: 70-99 mg/dL 102 (H) 284 (H)   Diabetes history: DM 2 Outpatient Diabetes medications: Lantus 16 units QHS Current orders for Inpatient glycemic control: None  Inpatient Diabetes Program Recommendations  Insulin - Basal: Patient has history of DM. Please order Lantus 14 units Q24 hrs. Correction (SSI): While inpatient, please order CBGs and Novolog 0-9 units ACHS.  Thanks,  Tama Headings RN, MSN, [facility] Inpatient Diabetes Coordinator Team Pager [phone]

## 2015-03-09 NOTE — Progress Notes (Signed)
TR BAND REMOVAL  LOCATION:  right radial  DEFLATED PER PROTOCOL:  Yes.    TIME BAND OFF / DRESSING APPLIED:   2145   SITE UPON ARRIVAL:   Level 0  SITE AFTER BAND REMOVAL:  Level 0  REVERSE ALLEN'S TEST:    positive  CIRCULATION SENSATION AND MOVEMENT:  Within Normal Limits  Yes.    COMMENTS:

## 2015-03-09 NOTE — Interval H&P Note (Signed)
Cath Lab Visit (complete for each Cath Lab visit)  Clinical Evaluation Leading to the Procedure:   ACS: Yes.    Non-ACS:    Anginal Classification: CCS IV  Anti-ischemic medical therapy: Maximal Therapy (2 or more classes of medications)  Non-Invasive Test Results: No non-invasive testing performed  Prior CABG: Previous CABG      History and Physical Interval Note:  03/09/2015 3:32 PM  Nicholas Henson Sr.  has presented today for surgery, with the diagnosis of cp  The various methods of treatment have been discussed with the patient and family. After consideration of risks, benefits and other options for treatment, the patient has consented to  Procedure(s): LEFT HEART CATHETERIZATION WITH CORONARY ANGIOGRAM (N/A) as a surgical intervention .  The patient's history has been reviewed, patient examined, no change in status, stable for surgery.  I have reviewed the patient's chart and labs.  Questions were answered to the patient's satisfaction.     Sinclair Grooms

## 2015-03-09 NOTE — H&P (View-Only) (Signed)
PROGRESS NOTE  Subjective:   Nicholas Henson was admitted with progressive CP with exertion. Scheduled for cath later today   Objective:    Vital Signs:   Temp:  [97.7 F (36.5 C)-97.8 F (36.6 C)] 97.8 F (36.6 C) (04/13 0547) Pulse Rate:  [47-87] 58 (04/13 0547) Resp:  [12-20] 18 (04/13 0547) BP: (112-158)/(53-96) 142/78 mmHg (04/13 0547) SpO2:  [91 %-98 %] 98 % (04/13 0547) Weight:  [148 lb 5.9 oz (67.3 kg)-150 lb (68.04 kg)] 150 lb (68.04 kg) (04/13 0547)  Last BM Date: 03/07/15   24-hour weight change: Weight change:   Weight trends: Filed Weights   03/08/15 1700 03/08/15 1827 03/09/15 0547  Weight: 149 lb 14.6 oz (68 kg) 148 lb 5.9 oz (67.3 kg) 150 lb (68.04 kg)    Intake/Output:  04/12 0701 - 04/13 0700 In: -  Out: 2100 [Urine:2100]     Physical Exam: BP 142/78 mmHg  Pulse 58  Temp(Src) 97.8 F (36.6 C) (Oral)  Resp 18  Ht 5\' 10"  (1.778 m)  Wt 150 lb (68.04 kg)  BMI 21.52 kg/m2  SpO2 98%  Wt Readings from Last 3 Encounters:  03/09/15 150 lb (68.04 kg)  05/25/14 143 lb 6.4 oz (65.046 kg)  01/27/14 141 lb (63.957 kg)    General: Vital signs reviewed and noted.   Head: Normocephalic, atraumatic.  Eyes: conjunctivae/corneas clear.  EOM's intact.   Throat: normal  Neck:  normal   Lungs:    clear   Heart:  RR   Abdomen:  Soft, non-tender, non-distended    Extremities: Good pulses    Neurologic: A&O X3, CN II - XII are grossly intact.   Psych: Normal     Labs: BMET:  Recent Labs  03/08/15 1134 03/09/15 0231  NA 135 135  K 4.1 3.9  CL 100 100  CO2 24 22  GLUCOSE 248* 120*  BUN 22 24*  CREATININE 1.19 1.08  CALCIUM 9.4 9.1    Liver function tests: No results for input(s): AST, ALT, ALKPHOS, BILITOT, PROT, ALBUMIN in the last 72 hours. No results for input(s): LIPASE, AMYLASE in the last 72 hours.  CBC:  Recent Labs  03/08/15 1134 03/09/15 0231  WBC 8.2 8.3  HGB 13.1 13.0  HCT 39.2 37.3*  MCV 85.4 84.6  PLT 100* 109*     Cardiac Enzymes:  Recent Labs  03/08/15 1636 03/08/15 1856 03/09/15 0231  TROPONINI 0.05* 0.04* 0.05*    Coagulation Studies:  Recent Labs  03/09/15 0231  LABPROT 14.3  INR 1.09    Other: Invalid input(s): POCBNP No results for input(s): DDIMER in the last 72 hours. No results for input(s): HGBA1C in the last 72 hours.  Recent Labs  03/09/15 0231  CHOL 111  HDL 26*  LDLCALC 19  TRIG 332*  CHOLHDL 4.3    Recent Labs  03/08/15 1856  TSH 2.065   No results for input(s): VITAMINB12, FOLATE, FERRITIN, TIBC, IRON, RETICCTPCT in the last 72 hours.   Other results:  EKG  ( personally reviewed )  - NSR TIW in inferior and anterior lateral leads   Medications:    Infusions: . sodium chloride 75 mL/hr at 03/09/15 0347  . heparin 1,100 Units/hr (03/09/15 0346)    Scheduled Medications: . amLODipine  5 mg Oral Daily  . aspirin  324 mg Oral NOW   Or  . aspirin  300 mg Rectal NOW  . [START ON 03/10/2015] aspirin  81 mg Oral Pre-Cath  .  aspirin EC  81 mg Oral Daily  . atorvastatin  40 mg Oral Daily  . clopidogrel  75 mg Oral Q breakfast  . digoxin  0.125 mg Oral Daily  . docusate sodium  100 mg Oral BID  . DULoxetine  30 mg Oral Daily  . famotidine  20 mg Oral Daily  . ferrous sulfate  325 mg Oral QPM  . finasteride  5 mg Oral Daily  . gabapentin  100 mg Oral BID AC  . gabapentin  300 mg Oral QHS  . HYDROcodone-acetaminophen  1 tablet Oral TID  . lisinopril  20 mg Oral Daily  . metoprolol  100 mg Oral BID  .  morphine injection  4 mg Intravenous Once  . rifampin  300 mg Oral BID  . sodium chloride  3 mL Intravenous Q12H    Assessment/ Plan:   Principal Problem:   Chest pain on exertion Active Problems:   Coronary artery disease   Peripheral arterial disease   Hypertension   Hyperlipidemia   Carotid artery disease   Diabetes mellitus  1. CAD:  Has extensive CAD hx.  For cath today. No further CP Troponin levels are mildly elevated   2.  Essential Hypertension:  BP is minimally elevated .  Continue meds.   3. Hyperlipidemia: continue meds  4.  PAD   5. DM :  Will get 1/2 his usual Lantus insulin today  Disposition:  For cath today  Length of Stay:   Ramond Dial., MD, [facility] 03/09/2015, 9:36 AM Office [phone] Pager [phone]

## 2015-03-10 DIAGNOSIS — I25110 Atherosclerotic heart disease of native coronary artery with unstable angina pectoris: Principal | ICD-10-CM

## 2015-03-10 LAB — BASIC METABOLIC PANEL
ANION GAP: 12 (ref 5–15)
BUN: 19 mg/dL (ref 6–23)
CALCIUM: 9.2 mg/dL (ref 8.4–10.5)
CO2: 23 mmol/L (ref 19–32)
Chloride: 102 mmol/L (ref 96–112)
Creatinine, Ser: 1.07 mg/dL (ref 0.50–1.35)
GFR, EST AFRICAN AMERICAN: 70 mL/min — AB (ref >90)
GFR, EST NON AFRICAN AMERICAN: 60 mL/min — AB (ref >90)
Glucose, Bld: 126 mg/dL — ABNORMAL HIGH (ref 70–99)
Potassium: 4.2 mmol/L (ref 3.5–5.1)
Sodium: 137 mmol/L (ref 135–145)

## 2015-03-10 LAB — HEMOGLOBIN A1C
Hgb A1c MFr Bld: 7.2 % — ABNORMAL HIGH (ref 4.8–5.6)
Mean Plasma Glucose: 160 mg/dL

## 2015-03-10 LAB — CBC
HEMATOCRIT: 38.3 % — AB (ref 39.0–52.0)
HEMOGLOBIN: 12.7 g/dL — AB (ref 13.0–17.0)
MCH: 28 pg (ref 26.0–34.0)
MCHC: 33.2 g/dL (ref 30.0–36.0)
MCV: 84.5 fL (ref 78.0–100.0)
Platelets: 101 10*3/uL — ABNORMAL LOW (ref 150–400)
RBC: 4.53 MIL/uL (ref 4.22–5.81)
RDW: 14.9 % (ref 11.5–15.5)
WBC: 7.5 10*3/uL (ref 4.0–10.5)

## 2015-03-10 LAB — GLUCOSE, CAPILLARY
GLUCOSE-CAPILLARY: 130 mg/dL — AB (ref 70–99)
GLUCOSE-CAPILLARY: 130 mg/dL — AB (ref 70–99)
Glucose-Capillary: 137 mg/dL — ABNORMAL HIGH (ref 70–99)

## 2015-03-10 MED ORDER — ATORVASTATIN CALCIUM 40 MG PO TABS: 40.0000 mg | ORAL_TABLET | Freq: Every day | ORAL | Status: DC

## 2015-03-10 MED ORDER — HYDROCODONE-ACETAMINOPHEN 10-325 MG PO TABS
1.0000 | ORAL_TABLET | Freq: Three times a day (TID) | ORAL | Status: DC | PRN
  Administered 2015-03-10 (×2): 1 via ORAL
  Filled 2015-03-10 (×2): qty 1

## 2015-03-10 MED FILL — Sodium Chloride IV Soln 0.9%: INTRAVENOUS | Qty: 50 | Status: AC

## 2015-03-10 NOTE — Progress Notes (Signed)
Patient pleasantly confused pulled out IV and trying to get OOB to walk to the ballgame although he is a R aka. Patient reoriented of place and situation. Activity apron given to occupy his time. Safety measure in place due to hall fall risk several attempt to get OOB. Patient placed in chair brought to nursing station since he not sleeping well seems content at this time  . Safety sitter requested when one is available.

## 2015-03-10 NOTE — Progress Notes (Signed)
[id number] Cardiac Rehab Pt walks very little, mostly gets around using his motorized wheelchair. He does have a prosthesis and walks some with assistance. Completed PCI education with pt, son and daughter in law. They voice understanding. Pt is not physically able to participate in Outpt CRP. He live in Assisted living facility. They give him his medications, but he does keep his own sl NTG. I discussed proper use of it. I encouraged hi  To be as active as he is able to be. Deon Pilling, RN 03/10/2015 10:11 AM

## 2015-03-10 NOTE — Discharge Summary (Signed)
Physician Discharge Summary     Cardiologist:  Gwenlyn Found  Patient ID: Nicholas Bash Sr. MRN: [id number] DOB/AGE: July 15, 1925 81 y.o.  Admit date: 03/08/2015 Discharge date: 03/10/2015  Admission Diagnoses:  Chest pain  Discharge Diagnoses:  Principal Problem:   Chest pain on exertion Active Problems:   Coronary artery disease   Peripheral arterial disease   Hypertension   Unstable angina   Hyperlipidemia   Carotid artery disease     Discharged Condition: stable  Hospital Course:   Nicholas Langenbach Sr. is an 81 y.o. male with a history of CAD, PAD, carotid artery disease, HTN, DM, chronic systolic CHF, HLD, who presented to [facility] ED for chest pain.   He has extensive CAD s/p angioplasty 1985, CABG x1 in 1996, heart attack in 2000 & 2001, PCTA sent 2004 & 2005, s/p 12/16/13 DES to SVG to LAD and found to have occluded RCA vein graft with left to right collaterals. OM1 PCI can be considered for symptoms, however never required as he was symptoms free until now.   He has extensive PAD s/p LE angiogram 1999, angioplasty leg and kidney 1999, bypass right leg 1999, aortobifemoral bypass grafting in 2009, right AKA 05/2013. Dopplers revealed a left ABI 0.9 widely patent left SFA 12/2013.  He had carotid dupplex 02/03/2015. Right proximal ICAs 5-69% diameter reduction, velocities suggested lower end of range. Left proximal ICA demonstrated a mild fibrous plaque w/o stenosis.  He had no chest pain since his last cath 11/2013 and never required to use nitro until today. He has been having intermittent cp with exertion for the past 2 weeks that has been getting worse. This AM he felt left sided chest pain/pressure which radiated to his left shoulder. He decided to call EMS as his cp is getting worse. He denies any associated SOB, Diaphoresis, nausea or vomiting. He was given nitro and 4 ASA By EMS. He is symptoms free now.   He smoked 3-4 packs a day up until 1985 .   He was admitted and started on IV heparin.   Troponin increased to 0.05.  He went for a left heart cath which revealed recurrent in-stent restenosis in the aged saphenous vein graft to the LAD.  He underwent successful balloon angioplasty of the region of restenosis within the saphenous vein graft. Total occlusion of saphenous vein graft to the right coronary.  Severe three-vessel coronary disease with total occlusion of native RCA, total occlusion of the mid LAD, high-grade obstruction in each of 2 large obtuse marginal branches, and moderately severe left main disease.  Left ventricular ejection fraction is 50% and there was hemodynamic evidence of diastolic heart failure.  The current procedure was done in a palliative mindset. Dr. Tamala Julian does not believe the patient is candidate for surgery. He has had restenosis in an aged saphenous vein graft 2 and further stenting did not seem prudent.  The patient was reported by RN as being confused the night before DC.  The patient was seen by Dr. Acie Fredrickson who felt he was stable for DC to skilled nursing facility.  Continue ASA, plavix.        Consults: Cardiac rehab, Diabetes Coor  Significant Diagnostic Studies:   Left Heart Catheterization with Coronary and Bypass Angiography and PCI Report  Nicholas Bash Sr.  81 y.o.  male 06-25-1925  Procedure Date: 03/09/2015 Referring Physician: Joaquim Nam, MD Primary Cardiologist: B. Crenshaw, MD  INDICATIONS: Unstable angina pectoris  PROCEDURE: 1. Left heart catheterization; 2. Coronary angiography; 3. Left ventriculography;  4. Bypass graft angiography; 5. PTCA SVG to LAD  CONSENT:  The risks, benefits, and details of the procedure were explained in detail to the patient. Risks including death, stroke, heart attack, kidney injury, allergy, limb ischemia, bleeding and radiation injury were discussed. The patient verbalized understanding and wanted to proceed. Informed written consent was obtained.  PROCEDURE TECHNIQUE: After Xylocaine anesthesia a 5  French Slender sheath was placed in the right radial artery with an angiocath and the modified Seldinger technique. Coronary angiography was done using a 5 F JR 4 and JL 3.5 cm diagnostic catheter. Left ventriculography was done using the JR 4 catheter and hand injection.   Review of the images demonstrated that the major change since the PCI in 2014 was set there is recurrent in-stent restenosis at the site of the previous intervention. The graft is otherwise diffusely diseased in the mid segment. There is severe native vessel disease with total occlusion of the RCA, total occlusion of the LAD, and high grade disease in the obtuse marginal branches of the circumflex. There is also left main disease. The patient has a right above-the-knee amputation. He is 81 years of age. He is not a surgical candidate in my opinion and after talking to the son, the patient is not likely to consider bypass. We decided to perform palliative angioplasty on the in-stent restenosis lesion in the LAD.   After multiple catheter exchanges, we used a 0.75 left Amplatz guide catheter to obtain guiding shots and perform the intervention. Bivalirudin bolus and infusion was started. ACT was documented to be greater than 300. An additional 150 mg of Plavix was administered orally. We have asked a BMW wire into the distal LAD via the saphenous vein graft to the LAD. We used a 15 mm x 4.0 mm Emerge Balloon and performed angioplasty on the in-stent restenosis with several overlapping balloon inflations up to 12 atm. The patient had severe chest discomfort with angioplasty. He also had transient no reflow after removal of the balloon. 400 g of verapamil and 200 g of nitroglycerin were given into the saphenous vein graft in 100 g in doses. This restored brisk TIMI grade 3 flow. During no reflow the patient has significant chest and arm discomfort. After disengagement of the artery at completion of the case, the discomfort gradually  resolved. No EKG changes were noted.  The case was terminated and a right radial wrist band was used to achieve hemostasis.  CONTRAST: Total of 175 cc.  COMPLICATIONS: Transient no reflow treated with pharmacology with prompt return of TIMI grade 3 flow   HEMODYNAMICS: Aortic pressure 156/52 mmHg; LV pressure 162/11 mmHg; LVEDP 18 mmHg  ANGIOGRAPHIC DATA: The left main coronary artery is heavily calcified and contains a proximal and mid 50% narrowing.  The left anterior descending artery is severely diseased. There is ostial 75-80% narrowing. After the second septal perforator the LAD is totally occluded. A moderate size first diagonal contains 99% stenosis.  The left circumflex artery is is severely diseased with ostial and proximal 50-70% narrowing, the large first obtuse marginal contains 85-90% proximal narrowing. The large second obtuse marginal contains eccentric 70% narrowing.  The right coronary artery is totally occluded in the mid vessel. The distal right coronary which is dominant feels by collaterals from left-to-right.  SAPHENOUS VEIN ANGIOGRAPHY:  The SVG to the RCA is totally occluded.  The SVG to the LAD is severely and diffusely diseased. The proximal segment which contains overlapping stents has a region of relatively  focal 99% in-stent restenosis. This is in the same region as the previous catheterization in 2014 which was treated with restenting. The mid graft is severely and diffusely diseased with up to 50-70% narrowing in multiple spots.  PCI RESULTS: The proximal region of severe in-stent restenosis within the saphenous vein graft was treated with balloon angioplasty using a 15 x 4.0 mm Emerge Balloon. Multiple inflations up to 12 atm were performed. Final angiography demonstrated reduction in stenosis from 99% to less than 20% with TIMI grade 3 flow. Patient had transient no reflow. This was treated with pharmacology as outlined above.  LEFT VENTRICULOGRAM:  Left ventricular angiogram was done in the 30 RAO projection and revealed moderate inferior wall hypokinesis. Mild anteroapical hypokinesis. EF 50%.   IMPRESSIONS: 1. Unstable angina due to recurrent in-stent restenosis in the aged saphenous vein graft to the LAD.  2. Successful balloon angioplasty of the region of restenosis within the saphenous vein graft. This is considered to be a palliative procedure.  3. Total occlusion of saphenous vein graft to the right coronary  4. Severe three-vessel coronary disease with total occlusion of native RCA, total occlusion of the mid LAD, high-grade obstruction in each of 2 large obtuse marginal branches, and moderately severe left main disease.  5. Left ventricular ejection fraction is 50% and there is hemodynamic evidence of diastolic heart failure   RECOMMENDATION: Continue aspirin and Plavix.  The current procedure was done in a palliative mindset. I do not believe the patient is candidate for surgery. He has had restenosis in an aged saphenous vein graft 2 and further stenting did not seem prudent.  Consider formal discussion of end-of-life and palliative care concerns prior to discharge.  Overall cardiovascular outlook is quite poor and I would recommend conservative medical management going forward. Treatments: See Above  Discharge Exam: Blood pressure 176/53, pulse 71, temperature 97.8 F (36.6 C), temperature source Oral, resp. rate 18, height _0  (1.778 m), weight 147 lb 11.3 oz (67 kg), SpO2 96 %.   Disposition: 01-Home or Self Care      Discharge Instructions    Diet - low sodium heart healthy    Complete by:  As directed      Discharge instructions    Complete by:  As directed   No lifting with right arm for three days.     Increase activity slowly    Complete by:  As directed             Medication List    STOP taking these medications        rifampin 300 MG capsule  Commonly known as:  RIFADIN      TAKE  these medications        acetaminophen 325 MG tablet  Commonly known as:  TYLENOL  Take 650 mg by mouth every 4 (four) hours as needed for mild pain.     amLODipine 5 MG tablet  Commonly known as:  NORVASC  Take 5 mg by mouth daily.     aspirin EC 81 MG tablet  Take 1 tablet (81 mg total) by mouth daily.     atorvastatin 40 MG tablet  Commonly known as:  LIPITOR  Take 1 tablet (40 mg total) by mouth daily.     cloNIDine 0.1 MG tablet  Commonly known as:  CATAPRES  Take 0.1 mg by mouth 3 (three) times daily as needed (if sbp >175).     clopidogrel 75 MG tablet  Commonly known as:  PLAVIX  Take 1 tablet (75 mg total) by mouth daily with breakfast.     digoxin 0.125 MG tablet  Commonly known as:  LANOXIN  Take 0.125 mg by mouth daily.     docusate sodium 100 MG capsule  Commonly known as:  COLACE  Take 100 mg by mouth 2 (two) times daily.     DULoxetine 30 MG capsule  Commonly known as:  CYMBALTA  Take 30 mg by mouth daily.     ferrous sulfate 325 (65 FE) MG tablet  Take 325 mg by mouth every evening.     finasteride 5 MG tablet  Commonly known as:  PROSCAR  Take 5 mg by mouth daily.     furosemide 40 MG tablet  Commonly known as:  LASIX  Take 1 tablet (40 mg total) by mouth daily.     gabapentin 100 MG capsule  Commonly known as:  NEURONTIN  Take 100-300 mg by mouth See admin instructions. Take 139m in the morning and afternoon, take 3077mat bedtime as directed.     HYDROcodone-acetaminophen 10-325 MG per tablet  Commonly known as:  NORCO  Take 1 tablet by mouth 3 (three) times daily.     insulin glargine 100 UNIT/ML injection  Commonly known as:  LANTUS  Inject 16 Units into the skin daily.     lidocaine 5 %  Commonly known as:  LIDODERM  Place 1 patch onto the skin daily as needed (pain). Remove & Discard patch within 12 hours or as directed by MD     lisinopril 20 MG tablet  Commonly known as:  PRINIVIL,ZESTRIL  Take 20 mg by mouth daily.      metoprolol 100 MG tablet  Commonly known as:  LOPRESSOR  Take 100 mg by mouth 2 (two) times daily.     multivitamin-lutein Caps capsule  Take 1 capsule by mouth daily.     MYLANTA 200-200-20 MG/5ML suspension  Generic drug:  alum & mag hydroxide-simeth  Take 15 mLs by mouth every 8 (eight) hours as needed for indigestion or heartburn.     nitroGLYCERIN 0.4 MG SL tablet  Commonly known as:  NITROSTAT  Place 0.4 mg under the tongue every 5 (five) minutes as needed for chest pain.     polyethylene glycol packet  Commonly known as:  MIRALAX / GLYCOLAX  Take 17 g by mouth daily.     ranitidine 150 MG tablet  Commonly known as:  ZANTAC  Take 150 mg by mouth 2 (two) times daily.     zinc sulfate 220 MG capsule  Take 220 mg by mouth daily.       Follow-up Information    [facility]   Specialty:  Cardiology   Why:  10:00 AM   Contact information:   [address] [phone]      Greater than 30 minutes was spent completing the patient's discharge.    Signed: Tarri Fuller PAFlorida Hospital Oceanside4/14/2016, 11:29 AM   Attending Note:   The patient was seen and examined.  Agree with assessment and plan as noted above.  Changes made to the above note as needed.  See my note from day of discharge  PhRamond Dial MD, FAKansas City Orthopaedic Institute/15/2016, 8:47 AM [address] [place] [phone]

## 2015-03-10 NOTE — Progress Notes (Signed)
PROGRESS NOTE  Subjective:   Pt was admitted with progressive CP with exertion. Scheduled for cath later today   Objective:    Vital Signs:   Temp:  [97.4 F (36.3 C)-97.9 F (36.6 C)] 97.8 F (36.6 C) (04/14 0810) Pulse Rate:  [59-73] 71 (04/14 0810) Resp:  [10-22] 18 (04/14 0810) BP: (144-195)/(53-95) 176/53 mmHg (04/14 0810) SpO2:  [93 %-96 %] 96 % (04/14 0517) Weight:  [147 lb 11.3 oz (67 kg)] 147 lb 11.3 oz (67 kg) (04/14 0015)  Last BM Date: 03/07/15   24-hour weight change: Weight change: -2 lb 3.3 oz (-1 kg)  Weight trends: Filed Weights   03/08/15 1827 03/09/15 0547 03/10/15 0015  Weight: 148 lb 5.9 oz (67.3 kg) 150 lb (68.04 kg) 147 lb 11.3 oz (67 kg)    Intake/Output:  04/13 0701 - 04/14 0700 In: 0  Out: 1700 [Urine:1700]     Physical Exam: BP 176/53 mmHg  Pulse 71  Temp(Src) 97.8 F (36.6 C) (Oral)  Resp 18  Ht 5\' 10"  (1.778 m)  Wt 147 lb 11.3 oz (67 kg)  BMI 21.19 kg/m2  SpO2 96%  Wt Readings from Last 3 Encounters:  03/10/15 147 lb 11.3 oz (67 kg)  05/25/14 143 lb 6.4 oz (65.046 kg)  01/27/14 141 lb (63.957 kg)    General: Vital signs reviewed and noted.   Head: Normocephalic, atraumatic.  Eyes: conjunctivae/corneas clear.  EOM's intact.   Throat: normal  Neck:  normal   Lungs:    clear   Heart:  RR   Abdomen:  Soft, non-tender, non-distended    Extremities: Good pulses    Neurologic: A&O X3, CN II - XII are grossly intact.   Psych: Normal     Labs: BMET:  Recent Labs  03/09/15 0231 03/10/15 0317  NA 135 137  K 3.9 4.2  CL 100 102  CO2 22 23  GLUCOSE 120* 126*  BUN 24* 19  CREATININE 1.08 1.07  CALCIUM 9.1 9.2    Liver function tests: No results for input(s): AST, ALT, ALKPHOS, BILITOT, PROT, ALBUMIN in the last 72 hours. No results for input(s): LIPASE, AMYLASE in the last 72 hours.  CBC:  Recent Labs  03/09/15 0231 03/10/15 0317  WBC 8.3 7.5  HGB 13.0 12.7*  HCT 37.3* 38.3*  MCV 84.6 84.5  PLT  109* 101*    Cardiac Enzymes:  Recent Labs  03/08/15 1636 03/08/15 1856 03/09/15 0231  TROPONINI 0.05* 0.04* 0.05*    Coagulation Studies:  Recent Labs  03/09/15 0231  LABPROT 14.3  INR 1.09    Other: Invalid input(s): POCBNP No results for input(s): DDIMER in the last 72 hours.  Recent Labs  03/08/15 1856  HGBA1C 7.2*    Recent Labs  03/09/15 0231  CHOL 111  HDL 26*  LDLCALC 19  TRIG 332*  CHOLHDL 4.3    Recent Labs  03/08/15 1856  TSH 2.065   No results for input(s): VITAMINB12, FOLATE, FERRITIN, TIBC, IRON, RETICCTPCT in the last 72 hours.   Other results:  EKG  ( personally reviewed )  - NSR TIW in inferior and anterior lateral leads   Medications:    Infusions:    Scheduled Medications: . amLODipine  5 mg Oral Daily  . aspirin  81 mg Oral Daily  . atorvastatin  40 mg Oral Daily  . clopidogrel  75 mg Oral Q breakfast  . digoxin  0.125 mg Oral Daily  . docusate sodium  100 mg Oral BID  . DULoxetine  30 mg Oral Daily  . ferrous sulfate  325 mg Oral QPM  . finasteride  5 mg Oral Daily  . gabapentin  100 mg Oral BID AC  . gabapentin  300 mg Oral QHS  . insulin aspart  0-5 Units Subcutaneous QHS  . insulin aspart  0-9 Units Subcutaneous TID WC  . insulin glargine  16 Units Subcutaneous QHS  . lisinopril  20 mg Oral Daily  . metoprolol  100 mg Oral BID  .  morphine injection  4 mg Intravenous Once  . rifampin  300 mg Oral BID    Assessment/ Plan:   Principal Problem:   Chest pain on exertion Active Problems:   Coronary artery disease   Peripheral arterial disease   Hypertension   Unstable angina   Hyperlipidemia   Carotid artery disease  1. CAD:  Has extensive CAD hx.   S/p POBA yesterday of SVG.  He feels much better.  DC to home today .  Continue ASA, plavix   2. Essential Hypertension:  BP is minimally elevated .  Continue meds.   3. Hyperlipidemia: continue atorva 40 a day   4.  PAD   5. DM :   Follow up with Dr.  Redmond Pulling   Disposition:     Length of Stay: 1  Thayer Headings, Brooke Bonito., MD, [facility] 03/10/2015, 8:19 AM Office [phone] Pager [phone]

## 2015-03-10 NOTE — Clinical Social Work Psychosocial (Signed)
Clinical Social Work Department BRIEF PSYCHOSOCIAL ASSESSMENT 03/10/2015  Patient:  Nicholas Henson, Nicholas Henson     Account Number:  [id number]     Admit date:  03/08/2015  Clinical Social Worker:  Elam Dutch  Date/Time:  03/09/2015 03:55 PM  Referred by:  Physician  Date Referred:  03/09/2015 Referred for  Other - See comment   Other Referral:   From an ALF   Interview type:  Other - See comment Other interview type:   Patient and son Roshon, [place].    PSYCHOSOCIAL DATA Living Status:  FACILITY Admitted from facility:  [place] Level of care:  Assisted Living Primary support name:  Zohair Epp  [phone] Primary support relationship to patient:  CHILD, ADULT Degree of support available:   Very strong support    CURRENT CONCERNS Current Concerns  Post-Acute Placement   Other Concerns:   Plan return to  ALF    SOCIAL WORK ASSESSMENT / PLAN 81 year old male- resident of [facility] in their Assisted Living unit. Patient is well known to this CSW from past hospitalizations.  He is a long term resident of the ALF at [facility] and he wants to return there when he is d/c'd from the hospital.  [place] spoke with Anderson Malta at [facility]. They will accept patient back when stable and will accomodate him if he requires short term SNF.  CSW spoke to patient's son Tahsin, [place].  He indicated that the family wants him to return to the Assisted Living level as he is very well cared for there.  Fl2 will be initiated and placed on chart for MD's signature. Family wants return to ALF even if PT recommends SNF.   Assessment/plan status:  Psychosocial Support/Ongoing Assessment of Needs Other assessment/ plan:   Information/referral to community resources:   None at this time.    PATIENT'S/FAMILY'S RESPONSE TO PLAN OF CARE: Patient is alert, oriented and very pleasant. He states that he is very happy in the assisted living facility at [facility] and wants to  return there. He denies any concerns or questions about this.  Patient relates that it was hard to accept placement but has settled in quite well. Patient's son and wife are very supportive and involved; they want patient to return to ALF.  CSW will assist with d/c.     **Assessment completed by Carmelia Bake, MSW, LCSW Licensed Clinical Social Worker [place] [phone]

## 2015-03-10 NOTE — Clinical Social Work Note (Signed)
Nicholas Henson will discharge today back to [facility]. Contact made with [facility] and d/c paperwork transmitted to her. The patient will be transported to facility by his son Nicholas, Groene., who was at the bedside.  Nicholas Henson, MSW, LCSW Licensed Clinical Social Worker [place] [phone]

## 2015-04-05 ENCOUNTER — Encounter: Payer: Self-pay | Admitting: Cardiology

## 2015-04-05 ENCOUNTER — Ambulatory Visit (INDEPENDENT_AMBULATORY_CARE_PROVIDER_SITE_OTHER): Payer: Medicare Other | Admitting: Cardiology

## 2015-04-05 VITALS — BP 152/80 | HR 60 | Ht 70.0 in | Wt 151.9 lb

## 2015-04-05 DIAGNOSIS — I739 Peripheral vascular disease, unspecified: Secondary | ICD-10-CM | POA: Diagnosis not present

## 2015-04-05 DIAGNOSIS — I6523 Occlusion and stenosis of bilateral carotid arteries: Secondary | ICD-10-CM

## 2015-04-05 DIAGNOSIS — E785 Hyperlipidemia, unspecified: Secondary | ICD-10-CM | POA: Diagnosis not present

## 2015-04-05 DIAGNOSIS — I6529 Occlusion and stenosis of unspecified carotid artery: Secondary | ICD-10-CM | POA: Diagnosis not present

## 2015-04-05 DIAGNOSIS — I10 Essential (primary) hypertension: Secondary | ICD-10-CM

## 2015-04-05 DIAGNOSIS — I2583 Coronary atherosclerosis due to lipid rich plaque: Principal | ICD-10-CM

## 2015-04-05 DIAGNOSIS — I2510 Atherosclerotic heart disease of native coronary artery without angina pectoris: Secondary | ICD-10-CM

## 2015-04-05 NOTE — Progress Notes (Signed)
Cardiology Office Note   Date:  04/05/2015   ID:  Charissa Bash Sr., DOB 24-Sep-1925, MRN [id number]  PCP:  Woody Seller, MD  Cardiologist:  Dr. Adora Fridge     Chief Complaint  Patient presents with  . Hospitalization Follow-up    angioplasty, for mild heart attack      History of Present Illness: Kayven Aldaco Sr. is a 81 y.o. male who presents for hospital follow up for CAD with PTCA for instent restenosis in VG->LAD after admit for progressive CP with exertion. This is felt to be a palliative procedure.   Dr. Tamala Julian in cath dictation suggests end of life discussion.   Dr. Tamala Julian does not believe the patient is candidate for surgery. He has had restenosis in an aged saphenous vein graft 2 and further stenting did not seem prudent. Currently pt is stable but Dr. Gwenlyn Found may need to address on next visit.  he has an extensive past medical history remarkable for coronary artery disease and peripheral arterial disease. He had angioplasty of his heart performed 05/14/84. Following this he had open heart surgery with coronary artery bypass grafting x1/1/96 for what sounds like felt enteroplasty. He had a heart attack 10/17/1999 and again in 11/30/99. He had angioplasty and stent 02/14/03 and 11/02/04. He really has not had any chest pain since that time until last night when he had 3 episodes of nitroglycerin responsive chest pain. He currently is chest pain-free. His history is remarkable for peripheral arterial disease as well status post lower extremity integrate him 07/06/98 with enteroplasty of ischemia right leg 08/24/98. He had right leg bypass 08/26/98 and what sounds like aortobifemoral bypass grafting for an abdominal aortic aneurysm and arterial occlusive disease in 2009. Most recently this past year he had right above-the-knee amputation performed on multiple occasions beginning 05/28/13 with multiple revisions because of poor wound healing the last surgery being 07/21/13. He does have the limb  ischemia with Cyanosis of his left first and fourth toes and resting leg pain. He saw a podiatrist last week for this and was referred to a vascular surgeon for further evaluation. He smoked 3-4 packs a day up until 1985 other problems include 2 hypertension. He's never had a stroke. EKG here in the hospital shows lateral T-wave inversion although we do not have an old EKG. The patient underwent PCI and stenting of a vein graft to the LAD with excellent antigastric result. He does have residual obtuse marginal branch stenosis. Because of the critical limb ischemia and complex anatomy documented by angiography he underwent antegrade approach of his left lower extremity diamondback orbital rotational affect become a PTA and stenting. followup low initially until Doppler studies revealed essentially widely patent with a near normal ABI. He is able to walk with a prosthesis on his right leg. Carotid Doppler showed moderate right internal carotid artery stenosis.   No complaints of chest pain or SOB today.  He has some increased indigestion relief with tums.  He is on zantac BID.  We discussed changing to PPI but pt prefers to stay on current meds unless symptoms increase.  No pain with walking with prosthesis.       Past Medical History  Diagnosis Date  . Hypertension   . History of echocardiogram     a. ECHO 12/13/13 unable to assess LV fxn. severely limited study, needs to be reapeated w/ defintiy contrast  . Chronic systolic CHF (congestive heart failure)     a. LHC 12/16/13 with severely  reduced LV systolic function with moderately to severely elevated left ventricular end-diastolic pressure  . Hyperlipidemia   . Kidney stones     "passed"  . Coronary artery disease     a. s/p angioplasty in 1985, coronary artery bypass grafting x1  in 1996, heart attack in 2000, PCTA/stent 2004 and 2005, s/p 12/16/13 DES to SVG to LAD   . Myocardial infarction 1998; 2000  . Type II diabetes mellitus   . GERD  (gastroesophageal reflux disease)   . Peripheral arterial disease     a. s/p lower extremity angiogram 1999, angioplasty leg and kidney 1999, bypass right leg 1999, aortobifemoral bypass grafting in 2009, right AKA  05/2013  . Arthritis     "hands" (03/08/2015)  . Squamous cell skin cancer, nasal tip     Past Surgical History  Procedure Laterality Date  . Carotid stent      pt denies this hx on 03/08/2015  . Leg amputation above knee Right   . Transurethral resection of prostate    . Angioplasty  1985  . Cholecystectomy    . Peripheral angiogram  12/14/2013    diamondback orbital rotational atherectomy, PTA and stent of high grade long segmental calcified proximal and mid left SFA stenosis with diamondback orbital rotational arthrectomy, I DEV stent using distal protection  . Left heart catheterization with coronary/graft angiogram N/A 12/09/2013    Procedure: LEFT HEART CATHETERIZATION WITH Beatrix Fetters;  Surgeon: Wellington Hampshire, MD;  Location: [facility] CATH LAB;  Service: Cardiovascular;  Laterality: N/A;  . Percutaneous coronary stent intervention (pci-s)  12/09/2013    Procedure: PERCUTANEOUS CORONARY STENT INTERVENTION (PCI-S);  Surgeon: Wellington Hampshire, MD;  Location: [facility] CATH LAB;  Service: Cardiovascular;;  . Abdominal angiogram N/A 12/10/2013    Procedure: ABDOMINAL ANGIOGRAM;  Surgeon: Lorretta Harp, MD;  Location: [facility] CATH LAB;  Service: Cardiovascular;  Laterality: N/A;  . Lower extremity angiogram N/A 12/10/2013    Procedure: LOWER EXTREMITY ANGIOGRAM;  Surgeon: Lorretta Harp, MD;  Location: [facility] CATH LAB;  Service: Cardiovascular;  Laterality: N/A;  . Percutaneous stent intervention Left 12/14/2013    Procedure: PERCUTANEOUS STENT INTERVENTION;  Surgeon: Lorretta Harp, MD;  Location: [facility] CATH LAB;  Service: Cardiovascular;  Laterality: Left;  lt sfa stent x2  . Lower extremity angiogram Left 12/14/2013    Procedure: LOWER EXTREMITY ANGIOGRAM;  Surgeon: Lorretta Harp,  MD;  Location: [facility] CATH LAB;  Service: Cardiovascular;  Laterality: Left;  . Carotid dupplex  02/03/2015    Right proximal ICAs 5-69% diameter reduction, velocities suggested lower end of range. Left proximal ICA demonstrated a mild fibrous plaque w/o sternosis  . Inguinal hernia repair Right   . Hernia repair    . Abdominal hernia repair    . Cataract extraction w/ intraocular lens  implant, bilateral Bilateral   . Coronary artery bypass graft  1996    "CABG X1"  . Coronary angioplasty    . Coronary angioplasty with stent placement  2004; 2005; 12/16/13     PCTA/stent 2004 and 2005, 12/16/13 DES to SVG to LAD   . Skin cancer excision      "tip of my nose"  . Melanoma excision      "think that's what they took off my back"  . Left heart catheterization with coronary angiogram N/A 03/09/2015    Procedure: LEFT HEART CATHETERIZATION WITH CORONARY ANGIOGRAM;  Surgeon: Belva Crome, MD;  Location: [facility] CATH LAB;  Service: Cardiovascular;  Laterality: N/A;  .  Percutaneous coronary stent intervention (pci-s)  03/09/2015    Procedure: PERCUTANEOUS CORONARY STENT INTERVENTION (PCI-S);  Surgeon: Belva Crome, MD;  Location: [facility] CATH LAB;  Service: Cardiovascular;;     Current Outpatient Prescriptions  Medication Sig Dispense Refill  . acetaminophen (TYLENOL) 325 MG tablet Take 650 mg by mouth every 4 (four) hours as needed for mild pain.    Marland Kitchen alum & mag hydroxide-simeth (MYLANTA) 200-200-20 MG/5ML suspension Take 15 mLs by mouth every 8 (eight) hours as needed for indigestion or heartburn.    Marland Kitchen amLODipine (NORVASC) 5 MG tablet Take 5 mg by mouth daily.    Marland Kitchen aspirin EC 81 MG tablet Take 1 tablet (81 mg total) by mouth daily.    . cloNIDine (CATAPRES) 0.1 MG tablet Take 0.1 mg by mouth 3 (three) times daily as needed (if sbp >175).    . clopidogrel (PLAVIX) 75 MG tablet Take 1 tablet (75 mg total) by mouth daily with breakfast. 30 tablet 11  . digoxin (LANOXIN) 0.125 MG tablet Take 0.125 mg by mouth  daily.    Marland Kitchen docusate sodium (COLACE) 100 MG capsule Take 100 mg by mouth 2 (two) times daily.    . DULoxetine (CYMBALTA) 30 MG capsule Take 30 mg by mouth daily.    . ferrous sulfate 325 (65 FE) MG tablet Take 325 mg by mouth every evening.    . finasteride (PROSCAR) 5 MG tablet Take 5 mg by mouth daily.    . furosemide (LASIX) 40 MG tablet Take 1 tablet (40 mg total) by mouth daily. 30 tablet 6  . gabapentin (NEURONTIN) 100 MG capsule Take 100-300 mg by mouth See admin instructions. Take 100mg  in the morning and afternoon, take 300mg  at bedtime as directed.    . gabapentin (NEURONTIN) 300 MG capsule Take 2 capsules by mouth at bedtime.    Marland Kitchen HYDROcodone-acetaminophen (NORCO) 10-325 MG per tablet Take 1 tablet by mouth 3 (three) times daily.     . insulin glargine (LANTUS) 100 UNIT/ML injection Inject 16 Units into the skin daily.    Marland Kitchen lidocaine (LIDODERM) 5 % Place 1 patch onto the skin daily as needed (pain). Remove & Discard patch within 12 hours or as directed by MD    . lisinopril (PRINIVIL,ZESTRIL) 20 MG tablet Take 20 mg by mouth daily.    . metoprolol (LOPRESSOR) 100 MG tablet Take 100 mg by mouth 2 (two) times daily.    . multivitamin-lutein (OCUVITE-LUTEIN) CAPS capsule Take 1 capsule by mouth daily.    . nitroGLYCERIN (NITROSTAT) 0.4 MG SL tablet Place 0.4 mg under the tongue every 5 (five) minutes as needed for chest pain.    . polyethylene glycol (MIRALAX / GLYCOLAX) packet Take 17 g by mouth daily.    . ranitidine (ZANTAC) 150 MG tablet Take 150 mg by mouth 2 (two) times daily.     No current facility-administered medications for this visit.    Allergies:   Chlorpazine; Metoclopramide; Oxycodone; and Tape    Social History:  The patient  reports that he quit smoking about 30 years ago. His smoking use included Cigarettes. He has a 120 pack-year smoking history. He has never used smokeless tobacco. He reports that he drinks alcohol. He reports that he does not use illicit drugs.     Family History:  The patient's family history includes Diabetes in his mother; Heart Problems in his mother.    ROS:  General:no colds or fevers, no weight changes Skin:no rashes or ulcers HEENT:no blurred  vision, no congestion CV:see HPI PUL:see HPI GI:no diarrhea constipation or melena, + indigestion GU:no hematuria, no dysuria MS:no joint pain, no claudication Neuro:no syncope, no lightheadedness Endo:+ diabetes, no thyroid disease  Wt Readings from Last 3 Encounters:  04/05/15 151 lb 14.4 oz (68.901 kg)  03/10/15 147 lb 11.3 oz (67 kg)  05/25/14 143 lb 6.4 oz (65.046 kg)     PHYSICAL EXAM: VS:  BP 152/80 mmHg  Pulse 60  Ht 5\' 10"  (1.778 m)  Wt 151 lb 14.4 oz (68.901 kg)  BMI 21.80 kg/m2 , BMI Body mass index is 21.8 kg/(m^2). General:Pleasant affect, NAD Skin:Warm and dry, brisk capillary refill HEENT:normocephalic, sclera clear, mucus membranes moist Neck:supple, no JVD, no bruits  Heart:S1S2 RRR without murmur, gallup, rub or click Lungs:clear without rales, rhonchi, or wheezes VPX:TGGY, non tender, + BS, do not palpate liver spleen or masses Ext:no lower ext edema on Lt, Rt AKA, 2+ radial pulses Neuro:alert and oriented X 3, MAE, follows commands, + facial symmetry    EKG:  EKG is ordered today. The ekg ordered today demonstrates SR 1st degree AV block t wave inverson II,III, AVF, V4-6 though overall improved.     Recent Labs: 03/08/2015: TSH 2.065 03/10/2015: BUN 19; Creatinine 1.07; Hemoglobin 12.7*; Platelets 101*; Potassium 4.2; Sodium 137    Lipid Panel    Component Value Date/Time   CHOL 111 03/09/2015 0231   TRIG 332* 03/09/2015 0231   HDL 26* 03/09/2015 0231   CHOLHDL 4.3 03/09/2015 0231   VLDL 66* 03/09/2015 0231   LDLCALC 19 03/09/2015 0231       Other studies Reviewed: Additional studies/ records that were reviewed today include:hospital notes, cath note..   ASSESSMENT AND PLAN:  Coronary artery disease History of CAD status post  coronary intervention multiple times in the past. He denies chest pain or shortness of breath today, Dr. Tamala Julian does not believe the patient is candidate for surgery. He has had restenosis in an aged saphenous vein graft 2 and further stenting did not seem prudent. Currently pt is stable but Dr. Gwenlyn Found may need to address on next visit.  S/p PTCA for in stent restenosis of VG-LAD.    Peripheral arterial disease The patient had critical limb ischemia. He had remote aortobifemoral bypass grafting. Angiogramed 12/09/13 revealing a long 90% calcified left SFA stenosis with 2 vessel runoff. 5 days later he returned for performed intervention to his aortobifem using antegrade access, diamondback orbital rotational atherectomy and stenting. Followup Dopplers performed 12/31/13 revealed a left ABI 0.94 with widely patent stent. His ulcers healed and his pain resolved. He now is able to ambulate with a prosthesis.  Carotid artery disease Moderate right internal carotid artery stenosis by duplex ultrasound. We'll continue to follow this up noninvasively. He is neurologically asymptomatic on aspirin and clopidogrel. LAST dopplers 01/2015 without change  Hyperlipidemia On statin therapy with recent lipid profile performed 12/10/13 revealed a total cholesterol of 111, LDL 19 and HDL of 26  Hypertension Controlled on current medications  Current medicines are reviewed with the patient today.  The patient Has no concerns regarding medicines.  The following changes have been made:  See above Labs/ tests ordered today include:see above  Disposition:   FU:  see above  Lennie Muckle, NP  04/05/2015 11:52 PM    [facility] [place] [address], [place], [place], [place] Phone: [phone]; Fax: [phone]

## 2015-04-05 NOTE — Patient Instructions (Signed)
Your physician recommends that you schedule a follow-up appointment in: 2-3 Months with Dr Gwenlyn Found  If Blood pressure stays greater that 140 please call office for medication adjustment

## 2015-07-12 ENCOUNTER — Encounter: Payer: Self-pay | Admitting: Cardiovascular Disease

## 2015-07-12 ENCOUNTER — Ambulatory Visit (INDEPENDENT_AMBULATORY_CARE_PROVIDER_SITE_OTHER): Payer: Medicare Other | Admitting: Cardiovascular Disease

## 2015-07-12 VITALS — BP 144/72 | HR 68 | Ht 70.5 in | Wt 151.0 lb

## 2015-07-12 DIAGNOSIS — I2583 Coronary atherosclerosis due to lipid rich plaque: Secondary | ICD-10-CM

## 2015-07-12 DIAGNOSIS — I2510 Atherosclerotic heart disease of native coronary artery without angina pectoris: Secondary | ICD-10-CM | POA: Diagnosis not present

## 2015-07-12 DIAGNOSIS — I10 Essential (primary) hypertension: Secondary | ICD-10-CM | POA: Diagnosis not present

## 2015-07-12 DIAGNOSIS — I6529 Occlusion and stenosis of unspecified carotid artery: Secondary | ICD-10-CM | POA: Diagnosis not present

## 2015-07-12 DIAGNOSIS — I739 Peripheral vascular disease, unspecified: Secondary | ICD-10-CM

## 2015-07-12 DIAGNOSIS — E785 Hyperlipidemia, unspecified: Secondary | ICD-10-CM | POA: Diagnosis not present

## 2015-07-12 NOTE — Assessment & Plan Note (Addendum)
History of coronary artery disease with a complex past medical history beginning with angioplasty 05/14/84. He had bypass grafting X 1 in 1996. Heart tach 10/17/1999 and again 11/30/1999. He had angioplasty  and stenting 02/14/03 and 11/02/04. He recently was admitted with unstable angina 03/08/15 and underwent cardiac catheterization by Dr. Tamala Julian. He performed angioplasty on a LAD vein graft but did not stent the patient. This was trimmed a "palliative" procedure and palliative care was discussed with the patient. Since his discharge he denies chest pain or shortness of breath.

## 2015-07-12 NOTE — Assessment & Plan Note (Signed)
History of peripheral arterial disease status post right AKA with left lower extremity critical limb ischemia back in January 2015. He has had aortobifemoral bypass grafting in the past. He underwent diamondback orbital rotational atherectomy, PTCA and stenting of his left SFA by myself with an excellent angiographic and clinical result. His wounds healed and his postprocedure Dopplers were essentially normal.

## 2015-07-12 NOTE — Progress Notes (Signed)
07/12/2015 Nicholas Nicholas Henson.   1925/08/24  [id number]  Primary Physician Woody Seller, MD Primary Cardiologist: Lorretta Harp MD Nicholas Nicholas Henson   HPI:  This is a 81y.o. male widowed Caucasian male father of 3 children who I last saw in the office 05/25/14. He recently relocated from [place] to [place] to be closer to his family.he has an extensive past medical history remarkable for coronary artery disease and peripheral arterial disease. He had angioplasty of his heart performed 05/14/84. Following this he had open heart surgery with coronary artery bypass grafting x1/1/96 for what sounds like failed angioplasty. He had a heart attack 10/17/1999 and again in 11/30/99. He had angioplasty and stent 02/14/03 and 11/02/04. He really has not had any chest pain since that time until last night when he had 3 episodes of nitroglycerin responsive chest pain. He currently is chest pain-free. His history is remarkable for peripheral arterial disease as well status post lower extremity integrate him 07/06/98 with enteroplasty of ischemia right leg 08/24/98. He had right leg bypass 08/26/98 and what sounds like aortobifemoral bypass grafting for an abdominal aortic aneurysm and arterial occlusive disease in 2009. Most recently this past year he had right above-the-knee amputation performed on multiple occasions beginning 05/28/13 with multiple revisions because of poor wound healing the last surgery being 07/21/13. He does have the limb ischemia with Cyanosis of his left first and fourth toes and resting leg pain. He saw a podiatrist last week for this and was referred to a vascular surgeon for further evaluation. He smoked 3-4 packs a day up until 1985 other problems include 2 hypertension. He's never had a stroke. EKG here in the hospital shows lateral T-wave inversion although we do not have an old EKG. The patient underwent PCI and stenting of a vein graft to the LAD with  excellent antigastric result. He does have residual obtuse marginal branch stenosis. Because of the critical limb ischemia and complex anatomy documented by angiography he underwent antegrade approach of his left lower extremity diamondback orbital rotational atherectomy,PTA and stenting. Follow-up lower extremity arterial  Doppler studies revealed essentially widely patent stent with a near normal ABI. He is able to walk with a prosthesis on his right leg. Carotid Doppler showed moderate right internal carotid artery stenosis. He was hospitalized with unstable angina in April of this year and underwent cardiac catheterization by Dr. Tamala Julian with balloon angioplasty of  restenotic area within a saphenous vein graft. His EF was 50%. Since discharge she denies chest pain or shortness of breath.   Current Outpatient Prescriptions  Medication Sig Dispense Refill  . acetaminophen (TYLENOL) 325 MG tablet Take 650 mg by mouth every 4 (four) hours as needed for mild pain.    Marland Kitchen alum & mag hydroxide-simeth (MYLANTA) 200-200-20 MG/5ML suspension Take 15 mLs by mouth every 8 (eight) hours as needed for indigestion or heartburn.    Marland Kitchen amLODipine (NORVASC) 5 MG tablet Take 5 mg by mouth daily.    Marland Kitchen aspirin EC 81 MG tablet Take 1 tablet (81 mg total) by mouth daily.    . cloNIDine (CATAPRES) 0.1 MG tablet Take 0.1 mg by mouth 3 (three) times daily as needed (if sbp >175).    . clopidogrel (PLAVIX) 75 MG tablet Take 1 tablet (75 mg total) by mouth daily with breakfast. 30 tablet 11  . digoxin (LANOXIN) 0.125 MG tablet Take 0.125 mg by mouth daily.    Marland Kitchen docusate sodium (COLACE) 100 MG  capsule Take 100 mg by mouth 2 (two) times daily.    . DULoxetine (CYMBALTA) 30 MG capsule Take 30 mg by mouth daily.    . ferrous sulfate 325 (65 FE) MG tablet Take 325 mg by mouth every evening.    . finasteride (PROSCAR) 5 MG tablet Take 5 mg by mouth daily.    . furosemide (LASIX) 40 MG tablet Take 1 tablet (40 mg total) by mouth  daily. 30 tablet 6  . gabapentin (NEURONTIN) 100 MG capsule Take 100-300 mg by mouth See admin instructions. Take 100mg  in the morning and afternoon, take 300mg  at bedtime as directed.    . gabapentin (NEURONTIN) 300 MG capsule Take 300 mg by mouth at bedtime.    Marland Kitchen HYDROcodone-acetaminophen (NORCO) 10-325 MG per tablet Take 1 tablet by mouth 3 (three) times daily.     . insulin glargine (LANTUS) 100 UNIT/ML injection Inject 16 Units into the skin daily.    Marland Kitchen lidocaine (LIDODERM) 5 % Place 1 patch onto the skin daily as needed (pain). Remove & Discard patch within 12 hours or as directed by MD    . lisinopril (PRINIVIL,ZESTRIL) 20 MG tablet Take 20 mg by mouth daily.    . metFORMIN (GLUCOPHAGE-XR) 500 MG 24 hr tablet Take 500 mg by mouth daily with breakfast.    . metoprolol (LOPRESSOR) 100 MG tablet Take 100 mg by mouth 2 (two) times daily.    . multivitamin-lutein (OCUVITE-LUTEIN) CAPS capsule Take 1 capsule by mouth daily.    . nitroGLYCERIN (NITROSTAT) 0.4 MG SL tablet Place 0.4 mg under the tongue every 5 (five) minutes as needed for chest pain.    . polyethylene glycol (MIRALAX / GLYCOLAX) packet Take 17 g by mouth daily.    . ranitidine (ZANTAC) 150 MG tablet Take 150 mg by mouth 2 (two) times daily.     No current facility-administered medications for this visit.    Allergies  Allergen Reactions  . Chlorpazine [Prochlorperazine] Other (See Comments)    unknown  . Metoclopramide Other (See Comments)    hyperactivity  . Oxycodone Other (See Comments)    Causes confusion  . Tape Hives and Other (See Comments)    blisters    Social History   Social History  . Marital Status: Widowed    Spouse Name: N/A  . Number of Children: N/A  . Years of Education: N/A   Occupational History  . Not on file.   Social History Main Topics  . Smoking status: Former Smoker -- 3.00 packs/day for 40 years    Types: Cigarettes    Quit date: 05/14/1984  . Smokeless tobacco: Never Used  .  Alcohol Use: Yes     Comment: 03/08/2015 "haven't had any alcohol in 10-15 yrs; hadn't drank much in the last 25 yrs"  . Drug Use: No  . Sexual Activity: No   Other Topics Concern  . Not on file   Social History Narrative     Review of Systems: General: negative for chills, fever, night sweats or weight changes.  Cardiovascular: negative for chest pain, dyspnea on exertion, edema, orthopnea, palpitations, paroxysmal nocturnal dyspnea or shortness of breath Dermatological: negative for rash Respiratory: negative for cough or wheezing Urologic: negative for hematuria Abdominal: negative for nausea, vomiting, diarrhea, bright red blood per rectum, melena, or hematemesis Neurologic: negative for visual changes, syncope, or dizziness All other systems reviewed and are otherwise negative except as noted above.    Blood pressure 144/72, pulse 68, height 5'  10.5" (1.791 m), weight 151 lb (68.493 kg).  General appearance: alert and no distress Neck: no adenopathy, no carotid bruit, no JVD, supple, symmetrical, trachea midline and thyroid not enlarged, symmetric, no tenderness/mass/nodules Lungs: clear to auscultation bilaterally Heart: regular rate and rhythm, S1, S2 normal, no murmur, click, rub or gallop Extremities: extremities normal, atraumatic, no cyanosis or edema  EKG not performed today  ASSESSMENT AND PLAN:   Peripheral arterial disease History of peripheral arterial disease status post right AKA with left lower extremity critical limb ischemia back in January 2015. He has had aortobifemoral bypass grafting in the past. He underwent diamondback orbital rotational atherectomy, PTCA and stenting of his left SFA by myself with an excellent angiographic and clinical result. His wounds healed and his postprocedure Dopplers were essentially normal.  Hypertension History of hypertension blood pressure measured at 144/72.  He is on amlodipine, clonidine, lisinopril and metoprolol.  Continue current meds at current dosing  Hyperlipidemia History of hyperlipidemia not on statin therapy. His most recent lipid profile performed 03/09/15 revealed total cholesterol 111, LDL of 19 and HDL 26.  Coronary artery disease History of coronary artery disease with a complex past medical history beginning with angioplasty 05/14/84. He had bypass grafting X 1 in 1996. Heart tach 10/17/1999 and again 11/30/1999. He had angioplasty  and stenting 02/14/03 and 11/02/04. He recently was admitted with unstable angina 03/08/15 and underwent cardiac catheterization by Dr. Tamala Julian. He performed angioplasty on a LAD vein graft but did not stent the patient. This was trimmed a "palliative" procedure and palliative care was discussed with the patient. Since his discharge he denies chest pain or shortness of breath.      Lorretta Harp MD FACP,FACC,FAHA, [facility] 07/12/2015 5:09 PM

## 2015-07-12 NOTE — Assessment & Plan Note (Signed)
History of hypertension blood pressure measured at 144/72.  He is on amlodipine, clonidine, lisinopril and metoprolol. Continue current meds at current dosing

## 2015-07-12 NOTE — Patient Instructions (Signed)
Dr Berry recommends that you schedule a follow-up appointment in 1 year. You will receive a reminder letter in the mail two months in advance. If you don't receive a letter, please call our office to schedule the follow-up appointment. 

## 2015-07-12 NOTE — Assessment & Plan Note (Signed)
History of hyperlipidemia not on statin therapy. His most recent lipid profile performed 03/09/15 revealed total cholesterol 111, LDL of 19 and HDL 26.

## 2015-07-25 ENCOUNTER — Telehealth: Payer: Self-pay | Admitting: Cardiovascular Disease

## 2015-07-25 NOTE — Telephone Encounter (Signed)
Manuela Schwartz( daughter in law) is calling because Mr. Broner has been having some pain in the leg that was amputated. Please call his Jaimen to speak about this . Thanks

## 2015-07-25 NOTE — Telephone Encounter (Signed)
Returned call and spoke with patient's son. He reports his dad had his right LE amputated r/t PAD and on Saturday he began having pains in his left foot that were similar to those of his right before the amputation. The pains were in the top of his left foot, shooting pains, about 8-9/10. Son reports Neurontin and pain medications were increased and helped a little. His left leg is warm and his toes and foot is of normal color. Son reports his pulse in left foot is weak (his wife, patient's daughter is Sports coach, is a Marine scientist).   Advise son I would send message to Dr. Gwenlyn Found to review and advise. Last LEA of left LE was 12/2013 and it was suggested to follow up when clinically indicated.

## 2015-07-26 ENCOUNTER — Ambulatory Visit (HOSPITAL_COMMUNITY)
Admission: RE | Admit: 2015-07-26 | Discharge: 2015-07-26 | Disposition: A | Discharge status: Home / Self Care | Payer: Medicare Other | Source: Ambulatory Visit | Attending: Cardiovascular Disease | Admitting: Cardiovascular Disease

## 2015-07-26 ENCOUNTER — Other Ambulatory Visit: Payer: Self-pay | Admitting: Cardiovascular Disease

## 2015-07-26 DIAGNOSIS — I739 Peripheral vascular disease, unspecified: Secondary | ICD-10-CM | POA: Diagnosis not present

## 2015-07-26 DIAGNOSIS — T82858A Stenosis of vascular prosthetic devices, implants and grafts, initial encounter: Secondary | ICD-10-CM | POA: Diagnosis not present

## 2015-07-26 DIAGNOSIS — Y838 Other surgical procedures as the cause of abnormal reaction of the patient, or of later complication, without mention of misadventure at the time of the procedure: Secondary | ICD-10-CM | POA: Diagnosis not present

## 2015-07-27 ENCOUNTER — Encounter: Payer: Self-pay | Admitting: Cardiovascular Disease

## 2015-07-27 ENCOUNTER — Ambulatory Visit (INDEPENDENT_AMBULATORY_CARE_PROVIDER_SITE_OTHER): Payer: Medicare Other | Admitting: Cardiovascular Disease

## 2015-07-27 VITALS — BP 140/62 | HR 61 | Ht 70.0 in | Wt 151.0 lb

## 2015-07-27 DIAGNOSIS — I739 Peripheral vascular disease, unspecified: Secondary | ICD-10-CM | POA: Diagnosis not present

## 2015-07-27 DIAGNOSIS — I6529 Occlusion and stenosis of unspecified carotid artery: Secondary | ICD-10-CM

## 2015-07-27 NOTE — Assessment & Plan Note (Signed)
Nicholas Henson returns today for follow-up of his recent Doppler studies. I saw him in the office 2 weeks ago. He had a complex diamondback orbital rotational atherectomy, PTA and stent procedure of highly calcified segmentally stenosed proximal and mid left SFA stenosis January 2015 for critical limb ischemia. His symptoms resolved after that. His healed and his Dopplers normalized in February. Now complains of some focal pain in his left foot which sounds more neuropathic from diabetes. His primary care physician increased his Neurontin. Dopplers performed yesterday showed a patent stent with a focal area of "in-stent restenosis probably in the 60-80% range. His foot is warm and he has a palpable pedal pulse. I do not think his symptoms are vascular this time. I recommend to the patient and his son that we stop the watchful waiting strategy and continue to follow him in the office. I will see him back in 3 months

## 2015-07-27 NOTE — Progress Notes (Signed)
Nicholas Henson returns today for follow-up of his recent Doppler studies. I saw him in the office 2 weeks ago. He had a complex diamondback orbital rotational atherectomy, PTA and stent procedure of highly calcified segmentally stenosed proximal and mid left SFA stenosis January 2015 for critical limb ischemia. His symptoms resolved after that. His healed and his Dopplers normalized in February. Now complains of some focal pain in his left foot which sounds more neuropathic from diabetes. His primary care physician increased his Neurontin. Dopplers performed yesterday showed a patent stent with a focal area of "in-stent restenosis probably in the 60-80% range. His foot is warm and he has a palpable pedal pulse. I do not think his symptoms are vascular this time. I recommend to the patient and his son that we stop the watchful waiting strategy and continue to follow him in the office. I will see him back in 3 months   Lorretta Harp, M.D., [place], [facility], [place], [place] [address]. [place], [place]  [address]  [phone] 07/27/2015 12:55 PM

## 2015-07-27 NOTE — Patient Instructions (Signed)
Your physician wants you to follow-up in: 3 months with Dr Berry. You will receive a reminder letter in the mail two months in advance. If you don't receive a letter, please call our office to schedule the follow-up appointment.  

## 2015-07-27 NOTE — Telephone Encounter (Signed)
Doppler done and results reviewed with Dr Gwenlyn Found.  Appt made to follow up with Dr Gwenlyn Found.

## 2015-10-25 ENCOUNTER — Ambulatory Visit: Payer: Medicare Other | Admitting: Cardiovascular Disease

## 2015-11-01 ENCOUNTER — Telehealth: Payer: Self-pay | Admitting: Cardiovascular Disease

## 2015-11-02 ENCOUNTER — Ambulatory Visit (INDEPENDENT_AMBULATORY_CARE_PROVIDER_SITE_OTHER): Payer: Medicare Other | Admitting: Vascular Surgery

## 2015-11-02 ENCOUNTER — Telehealth: Payer: Self-pay | Admitting: Cardiovascular Disease

## 2015-11-02 ENCOUNTER — Encounter: Payer: Self-pay | Admitting: Vascular Surgery

## 2015-11-02 VITALS — BP 143/68 | HR 75 | Temp 96.8°F | Resp 18 | Ht 70.0 in | Wt 155.0 lb

## 2015-11-02 DIAGNOSIS — I6529 Occlusion and stenosis of unspecified carotid artery: Secondary | ICD-10-CM

## 2015-11-02 DIAGNOSIS — T827XXA Infection and inflammatory reaction due to other cardiac and vascular devices, implants and grafts, initial encounter: Secondary | ICD-10-CM | POA: Diagnosis not present

## 2015-11-02 MED ORDER — DOXYCYCLINE HYCLATE 100 MG PO CAPS: 100.0000 mg | ORAL_CAPSULE | Freq: Two times a day (BID) | ORAL | Status: AC

## 2015-11-02 MED ORDER — DOXYCYCLINE HYCLATE 100 MG PO CAPS: 100.0000 mg | ORAL_CAPSULE | Freq: Two times a day (BID) | ORAL | Status: DC

## 2015-11-02 NOTE — Progress Notes (Signed)
Filed Vitals:   11/02/15 1239 11/02/15 1240  BP: 144/75 143/68  Pulse: 75 75  Temp: 96.8 F (36 C)   Resp: 18   Height: 5\' 10"  (1.778 m)   Weight: 155 lb (70.308 kg)   SpO2: 95%

## 2015-11-02 NOTE — Telephone Encounter (Signed)
Close encounter 

## 2015-11-02 NOTE — Addendum Note (Signed)
Addended by: Reola Calkins on: 11/02/2015 02:31 PM   Modules accepted: Orders

## 2015-11-02 NOTE — Progress Notes (Signed)
[facility] [place] HISTORY AND PHYSICAL   History of Present Illness:  Patient is a 81 y.o. male who presents for evaluation of exposed graft right groin. Patient had an aortobifemoral bypass graft in [place] approximately 15 years ago. This was done for aneurysm disease. 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 He also had dehiscence of the abdominal incision with evisceration which eventually required a mesh hernia repair and retention sutures. The patient has known peripheral arterial disease in the left lower extremity and has previously undergone atherectomy of his left SFA by Dr. Alvester Chou approximately a year ago. He has no complaints in the left leg. Several weeks ago he developed a red spot in the right groin. This eventually began to drain. This then turned into an open wound with exposed graft. The patient denies any fever or chills or weight loss. He has had no bleeding from the groin. The right limb of his aortobifem graft has been chronically occluded. Prior to the groin wound opening he was able to walk on a prosthetic limb.  Other medical problems include hypertension, hyperlipidemia, coronary artery disease, peripheral neuropathy, diabetes and congestive failure. These are all currently been stable.  Past Medical History  Diagnosis Date  . Hypertension   . History of echocardiogram     a. ECHO 12/13/13 unable to assess LV fxn. severely limited study, needs to be reapeated w/ defintiy contrast  . Chronic systolic CHF (congestive heart failure)     a. LHC 12/16/13 with severely reduced LV systolic function with moderately to severely elevated left ventricular end-diastolic pressure  . Hyperlipidemia   . Kidney stones     "passed"  . Coronary artery disease     a. s/p angioplasty in 1985, coronary artery bypass grafting x1  in 1996, heart attack in 2000,  PCTA/stent 2004 and 2005, s/p 12/16/13 DES to SVG to LAD   . Myocardial infarction ([place]) 1998; 2000  . Type II diabetes mellitus   . GERD (gastroesophageal reflux disease)   . Peripheral arterial disease     a. s/p lower extremity angiogram 1999, angioplasty leg and kidney 1999, bypass right leg 1999, aortobifemoral bypass grafting in 2009, right AKA  05/2013  . Arthritis     "hands" (03/08/2015)  . Squamous cell skin cancer, nasal tip     Past Surgical History  Procedure Laterality Date  . Carotid stent      pt denies this hx on 03/08/2015  . Leg amputation above knee Right   . Transurethral resection of prostate    . Angioplasty  1985  . Cholecystectomy    . Peripheral angiogram  12/14/2013    diamondback orbital rotational atherectomy, PTA and stent of high grade long segmental calcified proximal and mid left SFA stenosis with diamondback orbital rotational arthrectomy, I DEV stent using distal protection  . Left heart catheterization with coronary/graft angiogram N/A 12/09/2013    Procedure: LEFT HEART CATHETERIZATION WITH Beatrix Fetters;  Surgeon: Wellington Hampshire, MD;  Location: [facility] CATH LAB;  Service: Cardiovascular;  Laterality: N/A;  . Percutaneous coronary stent intervention (pci-s)  12/09/2013    Procedure: PERCUTANEOUS CORONARY STENT INTERVENTION (PCI-S);  Surgeon: Wellington Hampshire, MD;  Location: [facility] CATH LAB;  Service: Cardiovascular;;  . Abdominal angiogram N/A 12/10/2013    Procedure: ABDOMINAL ANGIOGRAM;  Surgeon: Lorretta Harp, MD;  Location: [facility] CATH LAB;  Service: Cardiovascular;  Laterality: N/A;  . Lower extremity angiogram N/A 12/10/2013    Procedure: LOWER EXTREMITY ANGIOGRAM;  Surgeon: Lorretta Harp, MD;  Location: [facility] CATH LAB;  Service: Cardiovascular;  Laterality: N/A;  . Percutaneous stent intervention Left 12/14/2013    Procedure: PERCUTANEOUS STENT INTERVENTION;  Surgeon: Lorretta Harp, MD;  Location: [facility] CATH LAB;  Service: Cardiovascular;   Laterality: Left;  lt sfa stent x2  . Lower extremity angiogram Left 12/14/2013    Procedure: LOWER EXTREMITY ANGIOGRAM;  Surgeon: Lorretta Harp, MD;  Location: [facility] CATH LAB;  Service: Cardiovascular;  Laterality: Left;  . Carotid dupplex  02/03/2015    Right proximal ICAs 5-69% diameter reduction, velocities suggested lower end of range. Left proximal ICA demonstrated a mild fibrous plaque w/o sternosis  . Inguinal hernia repair Right   . Hernia repair    . Abdominal hernia repair    . Cataract extraction w/ intraocular lens  implant, bilateral Bilateral   . Coronary artery bypass graft  1996    "CABG X1"  . Coronary angioplasty    . Coronary angioplasty with stent placement  2004; 2005; 12/16/13     PCTA/stent 2004 and 2005, 12/16/13 DES to SVG to LAD   . Skin cancer excision      "tip of my nose"  . Melanoma excision      "think that's what they took off my back"  . Left heart catheterization with coronary angiogram N/A 03/09/2015    Procedure: LEFT HEART CATHETERIZATION WITH CORONARY ANGIOGRAM;  Surgeon: Belva Crome, MD;  Location: [facility] CATH LAB;  Service: Cardiovascular;  Laterality: N/A;  . Percutaneous coronary stent intervention (pci-s)  03/09/2015    Procedure: PERCUTANEOUS CORONARY STENT INTERVENTION (PCI-S);  Surgeon: Belva Crome, MD;  Location: [facility] CATH LAB;  Service: Cardiovascular;;    Social History Social History  Substance Use Topics  . Smoking status: Former Smoker -- 3.00 packs/day for 40 years    Types: Cigarettes    Quit date: 05/14/1984  . Smokeless tobacco: Never Used  . Alcohol Use: Yes     Comment: 03/08/2015 "haven't had any alcohol in 10-15 yrs; hadn't drank much in the last 25 yrs"    Family History Family History  Problem Relation Age of Onset  . Heart Problems Mother   . Diabetes Mother     Allergies  Allergies  Allergen Reactions  . Chlorpazine [Prochlorperazine] Other (See Comments)    unknown  . Metoclopramide Other (See Comments)     hyperactivity  . Oxycodone Other (See Comments)    Causes confusion  . Tape Hives and Other (See Comments)    blisters     Current Outpatient Prescriptions  Medication Sig Dispense Refill  . acetaminophen (TYLENOL) 325 MG tablet Take 650 mg by mouth every 4 (four) hours as needed for mild pain.    Marland Kitchen alum & mag hydroxide-simeth (MYLANTA) 200-200-20 MG/5ML suspension Take 15 mLs by mouth every 8 (eight) hours as needed for indigestion or heartburn.    Marland Kitchen amLODipine (NORVASC) 5 MG tablet Take 5 mg by mouth daily.    Marland Kitchen aspirin EC 81 MG tablet Take 1 tablet (81 mg total) by mouth daily.    . cloNIDine (CATAPRES) 0.1 MG tablet Take 0.1 mg by mouth 3 (three) times daily as needed (if sbp >175).    . clopidogrel (PLAVIX) 75 MG tablet Take 1 tablet (75 mg total) by mouth daily with breakfast. 30 tablet 11  . digoxin (LANOXIN) 0.125 MG tablet Take  0.125 mg by mouth daily.    Marland Kitchen docusate sodium (COLACE) 100 MG capsule Take 100 mg by mouth 2 (two) times daily.    . DULoxetine (CYMBALTA) 30 MG capsule Take 30 mg by mouth daily.    . finasteride (PROSCAR) 5 MG tablet Take 5 mg by mouth daily.    . furosemide (LASIX) 40 MG tablet Take 1 tablet (40 mg total) by mouth daily. 30 tablet 6  . gabapentin (NEURONTIN) 100 MG capsule Take 300 mg by mouth 2 (two) times daily. Take 100mg  in the morning and afternoon, take 300mg  at bedtime as directed.    . gabapentin (NEURONTIN) 300 MG capsule Take 300 mg by mouth at bedtime.    Marland Kitchen HYDROcodone-acetaminophen (NORCO) 10-325 MG per tablet Take 1 tablet by mouth 3 (three) times daily.     . insulin glargine (LANTUS) 100 UNIT/ML injection Inject 16 Units into the skin daily.    Marland Kitchen lisinopril (PRINIVIL,ZESTRIL) 20 MG tablet Take 20 mg by mouth daily.    . metFORMIN (GLUCOPHAGE-XR) 500 MG 24 hr tablet Take 500 mg by mouth daily with breakfast.    . metoprolol (LOPRESSOR) 100 MG tablet Take 100 mg by mouth 2 (two) times daily.    . multivitamin-lutein (OCUVITE-LUTEIN) CAPS  capsule Take 1 capsule by mouth daily.    . nitroGLYCERIN (NITROSTAT) 0.4 MG SL tablet Place 0.4 mg under the tongue every 5 (five) minutes as needed for chest pain.    . polyethylene glycol (MIRALAX / GLYCOLAX) packet Take 17 g by mouth daily.    . ranitidine (ZANTAC) 150 MG tablet Take 150 mg by mouth 2 (two) times daily.    Marland Kitchen doxycycline (VIBRAMYCIN) 100 MG capsule Take 1 capsule (100 mg total) by mouth 2 (two) times daily. 60 capsule 6  . ferrous sulfate 325 (65 FE) MG tablet Take 325 mg by mouth every evening.    . lidocaine (LIDODERM) 5 % Place 1 patch onto the skin daily as needed (pain). Remove & Discard patch within 12 hours or as directed by MD     No current facility-administered medications for this visit.    ROS:   General:  No weight loss, Fever, chills  HEENT: No recent headaches, no nasal bleeding, no visual changes, no sore throat  Neurologic: No dizziness, blackouts, seizures. No recent symptoms of stroke or mini- stroke. No recent episodes of slurred speech, or temporary blindness.  Cardiac: No recent episodes of chest pain/pressure, no shortness of breath at rest.  + shortness of breath with exertion.  Denies history of atrial fibrillation or irregular heartbeat  Vascular: No history of rest pain in feet.  + history of claudication.  No history of non-healing ulcer, No history of DVT   Pulmonary: No home oxygen, no productive cough, no hemoptysis,  No asthma or wheezing  Musculoskeletal:  [ ]  Arthritis, [ ]  Low back pain,  [ ]  Joint pain  Hematologic:No history of hypercoagulable state.  No history of easy bleeding.  No history of anemia  Gastrointestinal: No hematochezia or melena,  No gastroesophageal reflux, no trouble swallowing  Urinary: [ ]  chronic Kidney disease, [ ]  on HD - [ ]  MWF or [ ]  TTHS, [ ]  Burning with urination, [ ]  Frequent urination, [ ]  Difficulty urinating;   Skin: No rashes  Psychological: No history of anxiety,  No history of  depression   Physical Examination  Filed Vitals:   11/02/15 1239 11/02/15 1240  BP: 144/75 143/68  Pulse: 75 75  Temp: 96.8 F (36 C)   Resp: 18   Height: 5\' 10"  (1.778 m)   Weight: 155 lb (70.308 kg)   SpO2: 95%     Body mass index is 22.24 kg/(m^2).  General:  Alert and oriented, no acute distress HEENT: Normal Neck: No bruit or JVD Pulmonary: Clear to auscultation bilaterally Cardiac: Regular Rate and Rhythm  Abdomen: Soft, non-tender, non-distended, no mass, well-healed midline laparotomy scar Skin: No rash Extremity Pulses:  2+ radial, brachial, 2+ left femoral, absent left dorsalis pedis, posterior tibial pulses, absent right femoral pulse  Musculoskeletal: Well-healed right above-knee amputation, 3 cm x 3 cm open wound right groin with graft material at the base of the wound which appears to be PTFE material  Neurologic: Upper and lower extremity motor 5/5 and symmetric   ASSESSMENT:  Infected right limb of aortobifemoral graft.   PLAN: Patient is a CT scan of abdomen and pelvis to determine full extent of the graft infection. If this appears to be limited to the right limb of the graft this would still require removal of the right limb of the graft through most likely a right retroperitoneal incision and then a right groin incision to remove all graft material from his femoral artery as well as patch this area. This would be an extensive and complicated procedure especially for an 81 year old. I discussed all these options with the patient and his son today. Options included continued local wound care with possibility of bleeding down the road in addition to antibiotic suppression. Other possibility is removal of the graft. If the entire graft is involved on the CT scan I would favor antibiotic suppression as I do not believe he would survive removal of the entire graft. He will follow-up with me in 2 weeks after his CT scan.   Patient was started on doxycycline 100 mg  twice daily today. He will do normal saline wet to dry dressings on the right groin.  Ruta Hinds, MD [facility] [place] Office: [phone] Pager: [phone]

## 2015-11-11 ENCOUNTER — Encounter: Payer: Self-pay | Admitting: Vascular Surgery

## 2015-11-16 ENCOUNTER — Telehealth: Payer: Self-pay

## 2015-11-16 ENCOUNTER — Other Ambulatory Visit: Payer: Self-pay | Admitting: *Deleted

## 2015-11-16 DIAGNOSIS — Z01812 Encounter for preprocedural laboratory examination: Secondary | ICD-10-CM

## 2015-11-16 NOTE — Telephone Encounter (Signed)
Pt's. son called to make  Dr. Oneida Alar aware of his condition change.  Reported his father had a "mild stroke" last week.  Stated he has weakness on right side  And confused.  Stated "there has been a steady loss of strength."  Reported his mobility has declined with the stroke.  Verb. concern that it would be difficult to transfer him to have the CT scan tomorrow.  Also stated he feels that of the 3 options given by Dr. Oneida Alar, in pt's current condition, he isn't sure that surgical tx. is a viable option, for any quality of life.  Is asking if Dr. Oneida Alar feels he should go through with the CT scan, and if not, does he need to see the pt., at the office, 12/22.  Will make Dr. Oneida Alar aware, and call pt's son back.

## 2015-11-16 NOTE — Telephone Encounter (Signed)
RE: need further recommendation  Received: Today    Elam Dutch, MD  Denman George, RN           Recommend him pursueing palliative care at this point. IF he changes his mind have him get CT and I will see him   Ruta Hinds     Phone call to pt's son, and informed of recommendation for Palliative care, and to call if condition improves and will reschedule CT scan and office follow-up at that point.  Son verb. Understanding.  Agreed with plan.

## 2015-11-17 ENCOUNTER — Inpatient Hospital Stay: Admission: RE | Admit: 2015-11-17 | Payer: Medicare Other | Source: Ambulatory Visit

## 2015-11-17 ENCOUNTER — Ambulatory Visit: Payer: Medicare Other | Admitting: Vascular Surgery

## 2015-11-17 ENCOUNTER — Ambulatory Visit: Payer: Medicare Other | Admitting: Cardiovascular Disease

## 2016-02-09 IMAGING — CR DG CHEST 1V PORT
1 series · 1 of 1 positions shown · non-contrast
Comparison: 12/09/2013

CLINICAL DATA: Left-sided chest pain since this morning.

EXAM:
PORTABLE CHEST - 1 VIEW

[AP]
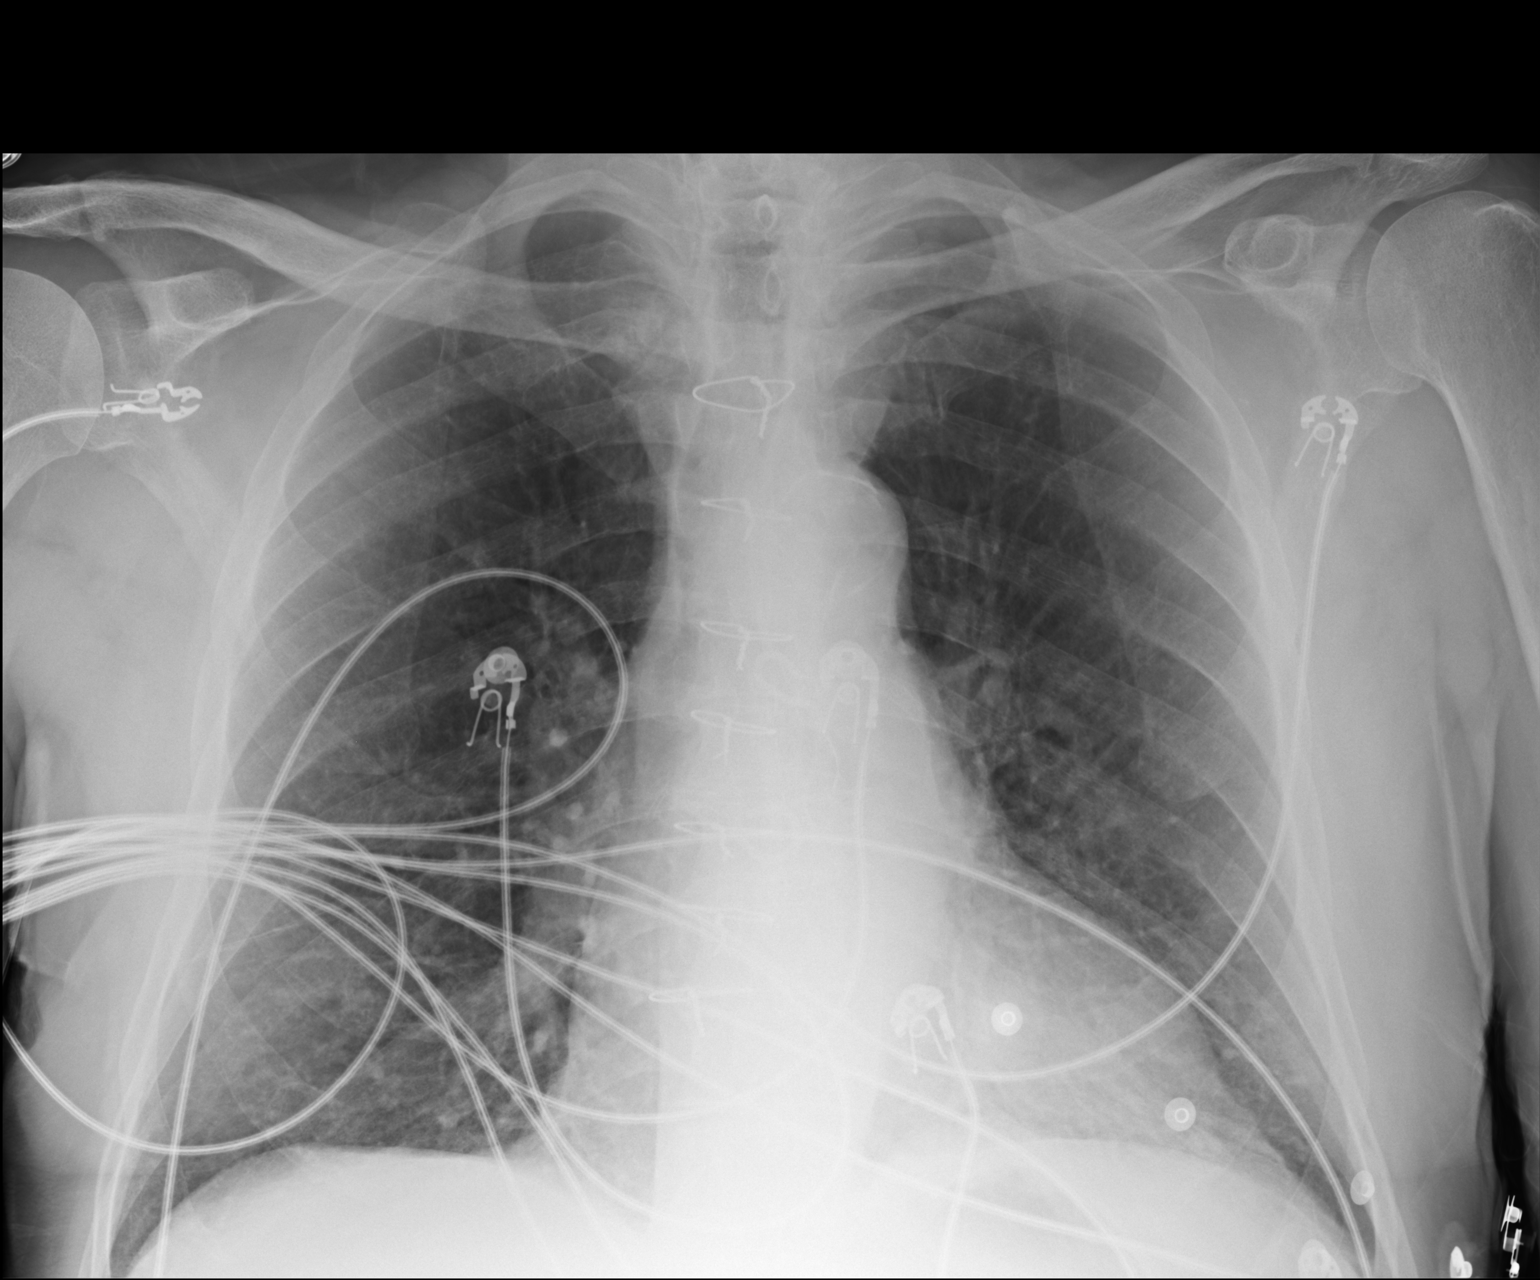

[1 of 1 positions shown; findings below may reference images not displayed]

FINDINGS: The heart size and mediastinal contours are within normal limits.
Both lungs are clear. The visualized skeletal structures are
unremarkable. Prior CABG.
IMPRESSION: No acute abnormalities.

## 2016-02-25 DEATH — deceased

## 2017-02-24 DEATH — deceased
